# Patient Record
Sex: Female | Born: 1975 | Race: Black or African American | Hispanic: No | Marital: Married | Smoking: Never smoker
Health system: Southern US, Community
[De-identification: ages and names within clinical notes are randomized; demographics above are authoritative.]

## PROBLEM LIST (undated history)

## (undated) DIAGNOSIS — E669 Obesity, unspecified: Secondary | ICD-10-CM

## (undated) DIAGNOSIS — J45909 Unspecified asthma, uncomplicated: Secondary | ICD-10-CM

## (undated) DIAGNOSIS — R569 Unspecified convulsions: Secondary | ICD-10-CM

## (undated) DIAGNOSIS — E7800 Pure hypercholesterolemia, unspecified: Secondary | ICD-10-CM

## (undated) DIAGNOSIS — I10 Essential (primary) hypertension: Secondary | ICD-10-CM

## (undated) DIAGNOSIS — E119 Type 2 diabetes mellitus without complications: Secondary | ICD-10-CM

## (undated) HISTORY — PX: CHOLECYSTECTOMY: SHX55

## (undated) HISTORY — PX: ABDOMINAL HYSTERECTOMY: SHX81

## (undated) HISTORY — PX: GASTRIC BYPASS: SHX52

---

## 2002-06-28 ENCOUNTER — Emergency Department (HOSPITAL_COMMUNITY): Admission: EM | Admit: 2002-06-28 | Discharge: 2002-06-29 | Payer: Self-pay | Admitting: Emergency Medicine

## 2002-06-28 ENCOUNTER — Encounter: Payer: Self-pay | Admitting: Emergency Medicine

## 2002-06-28 ENCOUNTER — Encounter: Payer: Self-pay | Admitting: Neurology

## 2002-07-04 ENCOUNTER — Encounter: Admission: RE | Admit: 2002-07-04 | Discharge: 2002-10-02 | Payer: Self-pay | Admitting: Neurology

## 2002-10-03 ENCOUNTER — Encounter: Admission: RE | Admit: 2002-10-03 | Discharge: 2002-10-06 | Payer: Self-pay | Admitting: Neurology

## 2016-09-06 ENCOUNTER — Encounter (HOSPITAL_BASED_OUTPATIENT_CLINIC_OR_DEPARTMENT_OTHER): Payer: Self-pay | Admitting: Emergency Medicine

## 2016-09-06 ENCOUNTER — Emergency Department (HOSPITAL_BASED_OUTPATIENT_CLINIC_OR_DEPARTMENT_OTHER)
Admission: EM | Admit: 2016-09-06 | Discharge: 2016-09-06 | Disposition: A | Discharge status: Home / Self Care | Payer: Medicaid Other | Attending: Emergency Medicine | Admitting: Emergency Medicine

## 2016-09-06 DIAGNOSIS — R21 Rash and other nonspecific skin eruption: Secondary | ICD-10-CM | POA: Insufficient documentation

## 2016-09-06 DIAGNOSIS — E119 Type 2 diabetes mellitus without complications: Secondary | ICD-10-CM | POA: Insufficient documentation

## 2016-09-06 DIAGNOSIS — J45909 Unspecified asthma, uncomplicated: Secondary | ICD-10-CM | POA: Insufficient documentation

## 2016-09-06 DIAGNOSIS — L2082 Flexural eczema: Secondary | ICD-10-CM | POA: Insufficient documentation

## 2016-09-06 DIAGNOSIS — Z79899 Other long term (current) drug therapy: Secondary | ICD-10-CM | POA: Diagnosis not present

## 2016-09-06 DIAGNOSIS — I10 Essential (primary) hypertension: Secondary | ICD-10-CM | POA: Diagnosis not present

## 2016-09-06 HISTORY — DX: Unspecified convulsions: R56.9

## 2016-09-06 HISTORY — DX: Essential (primary) hypertension: I10

## 2016-09-06 HISTORY — DX: Obesity, unspecified: E66.9

## 2016-09-06 HISTORY — DX: Type 2 diabetes mellitus without complications: E11.9

## 2016-09-06 HISTORY — DX: Unspecified asthma, uncomplicated: J45.909

## 2016-09-06 MED ORDER — TRIAMCINOLONE ACETONIDE 0.025 % EX OINT: 1.0000 "application " | TOPICAL_OINTMENT | Freq: Two times a day (BID) | CUTANEOUS | 0 refills | Status: DC

## 2016-09-06 MED ORDER — PREDNISONE 20 MG PO TABS: ORAL_TABLET | ORAL | 0 refills | Status: DC

## 2016-09-06 NOTE — ED Triage Notes (Signed)
Patient c/o rash to hands, legs, back, and mouth. Patient states she was seen at urgent care on Thursday and was told to apply Cortizone 10 cream. Patient states the rash started on her back. 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

## 2016-09-06 NOTE — ED Provider Notes (Signed)
[facility]   CSN: [id number] Arrival date & time: 09/06/16  1457  By signing my name below, I, Erica Mooney, attest that this documentation has been prepared under the direction and in the presence of Rolan BuccoMelanie Miamarie Moll, MD. Electronically signed, Erica Mooney, ED Scribe. 09/06/16. 5:03 PM.   History   Chief Complaint Chief Complaint  Patient presents with  . Rash    hands, legs, mouth, and back    The history is provided by the patient. No language interpreter was used.   HPI Comments: Erica Mooney is a 40 y.o. female brought in by a family member who has a PMhx of eczema presents to the Emergency Department complaining of itchy, gradual worsening, rash onset x 3 days. Pt states the rash started behind her ears and spread to her back, bilateral hands and bilateral legs. Pt states that she was placed on TPN after having gastric bypass on July 17th [place]. She states she was switched from clear liquids to only white liquids and her rash appeared shortly after.  She does have some oral intake.  Pt [facility] at [facility] last week and was told to apply cortisone 10 cream. The pt denies use of new soaps or detergents and hx of stomach bleeding or ulcers. She has a remote hx of eczema.  Past Medical History:  Diagnosis Date  . Asthma   . Diabetes mellitus without complication (HCC)    resolved with surgery  . Hypertension    resolved with surgery  . Obesity   . Seizures (HCC)     There are no active problems to display for this patient.   Past Surgical History:  Procedure Laterality Date  . ABDOMINAL HYSTERECTOMY    . CHOLECYSTECTOMY    . GASTRIC BYPASS     "sleeve"    OB History    No data available       Home Medications    Prior to Admission medications   Medication Sig Start Date End Date Taking? Authorizing Provider  levETIRAcetam (KEPPRA) 500 MG tablet Take 500 mg by mouth 2 (two) times daily.   Yes  Historical Provider, MD  predniSONE (DELTASONE) 20 MG tablet 2 tabs po daily x 5 days 09/06/16   Rolan BuccoMelanie Kalab Camps, MD  triamcinolone (KENALOG) 0.025 % ointment Apply 1 application topically 2 (two) times daily. 09/06/16   Rolan BuccoMelanie Carlton Sweaney, MD    Family History History reviewed. No pertinent family history.  Social History Social History  Substance Use Topics  . Smoking status: Never Smoker  . Smokeless tobacco: Never Used  . Alcohol use No     Allergies   Betadine [povidone iodine]; Caramel; Contrast media [iodinated diagnostic agents]; Iodine; and Strawberry (diagnostic)   Review of Systems Review of Systems  Constitutional: Negative for fever.  HENT: Negative for facial swelling.   Respiratory: Negative for shortness of breath.   Gastrointestinal: Negative for nausea and vomiting.  Musculoskeletal: Negative for arthralgias, back pain, joint swelling and neck pain.  Skin: Positive for rash. Negative for wound.  Neurological: Negative for weakness, numbness and headaches.  All other systems reviewed and are negative.    Physical Exam Updated Vital Signs BP 130/68 (BP Location: Left Arm)   Pulse 105   Temp 97.7 F (36.5 C) (Oral)   Resp 20   Ht 6\' 2"  (1.88 m)   Wt 280 lb (127 kg)   SpO2 100%   BMI 35.95 kg/m   Physical  Exam  Constitutional: She is oriented to person, place, and time. She appears well-developed and well-nourished.  HENT:  Head: Normocephalic and atraumatic.  Eyes: Pupils are equal, round, and reactive to light.  Neck: Normal range of motion. Neck supple.  Cardiovascular: Normal rate, regular rhythm and normal heart sounds.   Pulmonary/Chest: Effort normal and breath sounds normal. No respiratory distress. She has no wheezes. She has no rales. She exhibits no tenderness.  Abdominal: Soft. Bowel sounds are normal. There is no tenderness. There is no rebound and no guarding.  Musculoskeletal: Normal range of motion. She exhibits no edema.  Patient has a  PICC line in place to her left upper arm. The site appears clean. There is no evidence of infection.  Lymphadenopathy:    She has no cervical adenopathy.  Neurological: She is alert and oriented to person, place, and time.  Skin: Skin is warm and dry. No rash noted.  Scaly, eczematous like rash to flexor creases of elbows and behind both ears.  Small papules to both palms.  No vesicles.  No petechiae or purpura  Psychiatric: She has a normal mood and affect.     ED Treatments / Results  DIAGNOSTIC STUDIES: Oxygen Saturation is 100% on RA, normal by my interpretation.    COORDINATION OF CARE: 3:57 PM Will order steroid cream. Pt Instructed pt to obtain aquafor follow up with PCP. Discussed treatment plan with pt at bedside and pt agreed to plan.    Labs (all labs ordered are listed, but only abnormal results are displayed) Labs Reviewed - No data to display  EKG  EKG Interpretation None       Radiology No results found.  Procedures Procedures (including critical care time)  Medications Ordered in ED Medications - No data to display   Initial Impression / Assessment and Plan / ED Course  I have reviewed the triage vital signs and the nursing notes.  Pertinent labs & imaging results that were available during my care of the patient were reviewed by me and considered in my medical decision making (see chart for details).  Clinical Course    Pt with itchy eczematous like rash.  No signs of infection.  No systemic signs of illness.  Discussed using topical steroids and aquaphor.  Pt requests oral steroids.  I did give her a rx for this but advised her not to start it until being cleared for this by her bariatric surgeon.  Advised if she does start them, to take them with food.  She does have some oral intake in addition to the TPN.  Advised to f/u with her PCP if symptoms not improving.   Final Clinical Impressions(s) / ED Diagnoses   Final diagnoses:  Rash  Flexural  eczema    New Prescriptions Discharge Medication List as of 09/06/2016  4:06 PM    START taking these medications   Details  predniSONE (DELTASONE) 20 MG tablet 2 tabs po daily x 5 days, Print    triamcinolone (KENALOG) 0.025 % ointment Apply 1 application topically 2 (two) times daily., Starting Sun 09/06/2016, Print      I personally performed the services described in this documentation, which was scribed in my presence.  The recorded information has been reviewed and considered.    Rolan Bucco, MD 09/06/16 [phone]

## 2016-09-06 NOTE — ED Notes (Signed)
Pt given d/c instructions as per chart. Rx x 2. Verbalizes understanding. No questions. 

## 2016-12-17 ENCOUNTER — Encounter (HOSPITAL_BASED_OUTPATIENT_CLINIC_OR_DEPARTMENT_OTHER): Payer: Self-pay | Admitting: *Deleted

## 2016-12-17 ENCOUNTER — Emergency Department (HOSPITAL_BASED_OUTPATIENT_CLINIC_OR_DEPARTMENT_OTHER)
Admission: EM | Admit: 2016-12-17 | Discharge: 2016-12-17 | Disposition: A | Discharge status: Home / Self Care | Payer: Medicaid Other | Attending: Emergency Medicine | Admitting: Emergency Medicine

## 2016-12-17 DIAGNOSIS — E119 Type 2 diabetes mellitus without complications: Secondary | ICD-10-CM | POA: Insufficient documentation

## 2016-12-17 DIAGNOSIS — S60463A Insect bite (nonvenomous) of left middle finger, initial encounter: Secondary | ICD-10-CM | POA: Diagnosis present

## 2016-12-17 DIAGNOSIS — J45909 Unspecified asthma, uncomplicated: Secondary | ICD-10-CM | POA: Diagnosis not present

## 2016-12-17 DIAGNOSIS — Y929 Unspecified place or not applicable: Secondary | ICD-10-CM | POA: Insufficient documentation

## 2016-12-17 DIAGNOSIS — I10 Essential (primary) hypertension: Secondary | ICD-10-CM | POA: Diagnosis not present

## 2016-12-17 DIAGNOSIS — L089 Local infection of the skin and subcutaneous tissue, unspecified: Secondary | ICD-10-CM

## 2016-12-17 DIAGNOSIS — W57XXXA Bitten or stung by nonvenomous insect and other nonvenomous arthropods, initial encounter: Secondary | ICD-10-CM | POA: Insufficient documentation

## 2016-12-17 DIAGNOSIS — Y999 Unspecified external cause status: Secondary | ICD-10-CM | POA: Diagnosis not present

## 2016-12-17 DIAGNOSIS — Y939 Activity, unspecified: Secondary | ICD-10-CM | POA: Insufficient documentation

## 2016-12-17 HISTORY — DX: Pure hypercholesterolemia, unspecified: E78.00

## 2016-12-17 LAB — CBG MONITORING, ED: Glucose-Capillary: 91 mg/dL (ref 65–99)

## 2016-12-17 MED ORDER — BACITRACIN ZINC 500 UNIT/GM EX OINT
1.0000 "application " | TOPICAL_OINTMENT | Freq: Once | CUTANEOUS | Status: AC
  Administered 2016-12-17: 1 via TOPICAL

## 2016-12-17 MED ORDER — CLINDAMYCIN HCL 150 MG PO CAPS: 300.0000 mg | ORAL_CAPSULE | Freq: Three times a day (TID) | ORAL | 0 refills | Status: AC

## 2016-12-17 NOTE — Discharge Instructions (Signed)
Apply warm compresses to bumps for 20 minutes at a time, several times per day until resolved. Return if you have swelling or worsening pain of your finger, or if areas become worse despite treatment.

## 2016-12-17 NOTE — ED Provider Notes (Signed)
[facility]Y DEPT Provider Note   CSN: [id number] Arrival date & time: 12/17/16 1204     History    Chief Complaint  Patient presents with  . Insect Bite     HPI Erica Mooney is a 41 y.o. female.  41yo F w/ PMH including HTN, HLD, bariatric surgery, seizures, borderline DM who p/w concern for spider bite. Wilford GristYesterday,she noticed a small, painful bump on the back of her left middle finger. She squeezed some dark drainage from the bump and it is currently still sore. Yesterday evening, she noticed some burning pain on R posterior shoulder w/ associated painful bump. No drainage in this area. She denies seeing any insects. She denies fevers, vomiting, or recent illness but states that she has been fatigued recently.    Past Medical History:  Diagnosis Date  . Asthma   . Diabetes mellitus without complication (HCC)    resolved with surgery  . High cholesterol   . Hypertension    resolved with surgery  . Obesity   . Seizures (HCC)      There are no active problems to display for this patient.   Past Surgical History:  Procedure Laterality Date  . ABDOMINAL HYSTERECTOMY    . CHOLECYSTECTOMY    . GASTRIC BYPASS     "sleeve"    OB History    No data available        Home Medications    Prior to Admission medications   Medication Sig Start Date End Date Taking? Authorizing Provider  levETIRAcetam (KEPPRA) 500 MG tablet Take 500 mg by mouth 2 (two) times daily.   Yes Historical Provider, MD  predniSONE (DELTASONE) 20 MG tablet 2 tabs po daily x 5 days 09/06/16   Rolan BuccoMelanie Belfi, MD  triamcinolone (KENALOG) 0.025 % ointment Apply 1 application topically 2 (two) times daily. 09/06/16   Rolan BuccoMelanie Belfi, MD      No family history on file.   Social History  Substance Use Topics  . Smoking status: Never Smoker  . Smokeless tobacco: Never Used  . Alcohol use No     Allergies     Betadine [povidone iodine]; Caramel; Contrast media [iodinated diagnostic agents];  Iodine; and Strawberry (diagnostic)    Review of Systems  10 Systems reviewed and are negative for acute change except as noted in the HPI.   Physical Exam Updated Vital Signs BP 100/62 (BP Location: Right Arm)   Pulse 94   Temp 98.3 F (36.8 C) (Oral)   Resp 18   Ht 6\' 2"  (1.88 m)   Wt 280 lb (127 kg)   SpO2 98%   BMI 35.95 kg/m   Physical Exam  Constitutional: She is oriented to person, place, and time. She appears well-developed and well-nourished. No distress.  HENT:  Head: Normocephalic and atraumatic.  Eyes: Conjunctivae are normal.  Neck: Neck supple.  Musculoskeletal: Normal range of motion. She exhibits tenderness (dorsal proximal L middle finger). She exhibits no edema.  Neurological: She is alert and oriented to person, place, and time.  Skin: Skin is warm and dry.  Small punctate red macule with center unroofed on dorsal L middle finger, between MCP and PIP joints, no surrounding erythema and no drainage; no swelling of finger; 0.2cm erythematous macule on R posterior shoulder, tender to palpation but no active drainage, no surrounding erythema  Psychiatric: She has a normal mood and affect. Judgment normal.  Nursing note and vitals reviewed.     ED Treatments / Results  Labs (all  labs ordered are listed, but only abnormal results are displayed) Labs Reviewed  CBG MONITORING, ED     EKG  EKG Interpretation  Date/Time:    Ventricular Rate:    PR Interval:    QRS Duration:   QT Interval:    QTC Calculation:   R Axis:     Text Interpretation:           Radiology No results found.  Procedures Procedures (including critical care time) Procedures  Medications Ordered in ED  Medications  bacitracin ointment 1 application (not administered)     Initial Impression / Assessment and Plan / ED Course  I have reviewed the triage vital signs and the nursing notes.  Pertinent labs  that were available during my care of the patient were  reviewed by me and considered in my medical decision making (see chart for details).  Clinical Course     Pt w/ 2 areas of concern for insect bite. L finger w/ small ruptured bump, possibly infected hair follicle that is now drained. Applied bacitracin. Area on shoulder looks like small pimple. US shows no fluid collection in either area. Because of pt's hx of DM, will provide w/ short course of antibiotics to cover cellulitis. BG normal here. Instructed to apply warm compresses and reviewed return precautions including worsening skin changes, finger swelling, or worsening pain. Patient voiced understanding and was discharged in satisfactory condition.  Final Clinical Impressions(s) / ED Diagnoses   Final diagnoses:  None     New Prescriptions   No medications on file       Laurence Spates, MD 12/17/16 1315

## 2016-12-17 NOTE — ED Notes (Signed)
ED MD informed of d/c VS

## 2016-12-17 NOTE — ED Triage Notes (Signed)
States she got bit by a spider on her left middle finger and right shoulder. States was able to squeeze thick substance from her finger and now it is sore. The site on her shoulder is intact as a small raised pimple.

## 2018-01-16 ENCOUNTER — Emergency Department (HOSPITAL_BASED_OUTPATIENT_CLINIC_OR_DEPARTMENT_OTHER): Payer: Medicaid Other

## 2018-01-16 ENCOUNTER — Encounter (HOSPITAL_BASED_OUTPATIENT_CLINIC_OR_DEPARTMENT_OTHER): Payer: Self-pay | Admitting: *Deleted

## 2018-01-16 ENCOUNTER — Emergency Department (HOSPITAL_BASED_OUTPATIENT_CLINIC_OR_DEPARTMENT_OTHER)
Admission: EM | Admit: 2018-01-16 | Discharge: 2018-01-16 | Disposition: A | Discharge status: Home / Self Care | Payer: Medicaid Other | Attending: Emergency Medicine | Admitting: Emergency Medicine

## 2018-01-16 ENCOUNTER — Other Ambulatory Visit: Payer: Self-pay

## 2018-01-16 DIAGNOSIS — Z79899 Other long term (current) drug therapy: Secondary | ICD-10-CM | POA: Insufficient documentation

## 2018-01-16 DIAGNOSIS — K529 Noninfective gastroenteritis and colitis, unspecified: Secondary | ICD-10-CM | POA: Diagnosis not present

## 2018-01-16 DIAGNOSIS — R51 Headache: Secondary | ICD-10-CM | POA: Insufficient documentation

## 2018-01-16 DIAGNOSIS — J45909 Unspecified asthma, uncomplicated: Secondary | ICD-10-CM | POA: Insufficient documentation

## 2018-01-16 DIAGNOSIS — I10 Essential (primary) hypertension: Secondary | ICD-10-CM | POA: Diagnosis not present

## 2018-01-16 DIAGNOSIS — E119 Type 2 diabetes mellitus without complications: Secondary | ICD-10-CM | POA: Insufficient documentation

## 2018-01-16 DIAGNOSIS — R1110 Vomiting, unspecified: Secondary | ICD-10-CM | POA: Diagnosis present

## 2018-01-16 LAB — CBC
HEMATOCRIT: 36.1 % (ref 36.0–46.0)
HEMOGLOBIN: 11.4 g/dL — AB (ref 12.0–15.0)
MCH: 24.7 pg — AB (ref 26.0–34.0)
MCHC: 31.6 g/dL (ref 30.0–36.0)
MCV: 78.1 fL (ref 78.0–100.0)
Platelets: 235 10*3/uL (ref 150–400)
RBC: 4.62 MIL/uL (ref 3.87–5.11)
RDW: 14.1 % (ref 11.5–15.5)
WBC: 6.6 10*3/uL (ref 4.0–10.5)

## 2018-01-16 LAB — BASIC METABOLIC PANEL
ANION GAP: 8 (ref 5–15)
BUN: 7 mg/dL (ref 6–20)
CALCIUM: 8.9 mg/dL (ref 8.9–10.3)
CO2: 24 mmol/L (ref 22–32)
Chloride: 107 mmol/L (ref 101–111)
Creatinine, Ser: 0.7 mg/dL (ref 0.44–1.00)
GFR calc non Af Amer: >60 mL/min (ref >60)
GLUCOSE: 102 mg/dL — AB (ref 65–99)
POTASSIUM: 3.7 mmol/L (ref 3.5–5.1)
Sodium: 139 mmol/L (ref 135–145)

## 2018-01-16 MED ORDER — DICYCLOMINE HCL 10 MG PO CAPS
10.0000 mg | ORAL_CAPSULE | Freq: Once | ORAL | Status: AC
  Administered 2018-01-16: 10 mg via ORAL
  Filled 2018-01-16: qty 1

## 2018-01-16 MED ORDER — METOCLOPRAMIDE HCL 10 MG PO TABS: 10.0000 mg | ORAL_TABLET | Freq: Four times a day (QID) | ORAL | 0 refills | Status: AC | PRN

## 2018-01-16 MED ORDER — DICYCLOMINE HCL 20 MG PO TABS: 20.0000 mg | ORAL_TABLET | Freq: Two times a day (BID) | ORAL | 0 refills | Status: DC | PRN

## 2018-01-16 MED ORDER — ACETAMINOPHEN 500 MG PO TABS
1000.0000 mg | ORAL_TABLET | Freq: Once | ORAL | Status: AC
  Administered 2018-01-16: 1000 mg via ORAL
  Filled 2018-01-16: qty 2

## 2018-01-16 NOTE — ED Notes (Signed)
ED Provider at bedside. 

## 2018-01-16 NOTE — ED Provider Notes (Addendum)
[facility] EMERGENCY DEPARTMENT Provider Note   CSN: [id number] Arrival date & time: 01/16/18  0920     History   Chief Complaint Chief Complaint  Patient presents with  . Emesis    HPI Erica Mooney is a 42 y.o. female.  Patient complains of vomiting diarrhea and migraine headache onset yesterday associated symptoms include abdominal cramps, diffuse.  No fever.  No hematemesis no blood per rectum.  Several others in her family have similar symptoms.  Reports 7 episodes of vomiting and 9 episodes of diarrhea.  Headache is left-sided parietal region typical of migraine she had in the past she denies any lightheadedness .  She went to another hospital prior to coming here she was given Zofran while in the waiting room however did not wait for medical evaluation.  She denies nausea at present.  HPI  Past Medical History:  Diagnosis Date  . Asthma   . Diabetes mellitus without complication (HCC)    resolved with surgery  . High cholesterol   . Hypertension    resolved with surgery  . Obesity   . Seizures (HCC)     There are no active problems to display for this patient.   Past Surgical History:  Procedure Laterality Date  . ABDOMINAL HYSTERECTOMY    . CHOLECYSTECTOMY    . GASTRIC BYPASS     "sleeve"    OB History    No data available       Home Medications    Prior to Admission medications   Medication Sig Start Date End Date Taking? Authorizing Provider  levETIRAcetam (KEPPRA) 500 MG tablet Take 500 mg by mouth 2 (two) times daily.    [provider]    Family History History reviewed. No pertinent family history.  Social History Social History   Tobacco Use  . Smoking status: Never Smoker  . Smokeless tobacco: Never Used  Substance Use Topics  . Alcohol use: No  . Drug use: No     Allergies   Betadine [povidone iodine]; Caramel; Contrast media [iodinated diagnostic agents]; Iodine; and Strawberry (diagnostic)   Review of  Systems Review of Systems  Constitutional: Negative.   HENT: Negative.   Respiratory: Negative.   Cardiovascular: Negative.   Gastrointestinal: Positive for abdominal pain, diarrhea and vomiting.  Musculoskeletal: Negative.   Skin: Negative.   Neurological: Positive for headaches.  Psychiatric/Behavioral: Negative.   All other systems reviewed and are negative.    Physical Exam Updated Vital Signs BP 123/82   Pulse 78   Temp 98.3 F (36.8 C) (Oral)   Resp 16   Ht 6\' 2"  (1.88 m)   Wt 132 kg (291 lb)   SpO2 97%   BMI 37.36 kg/m   Physical Exam  Constitutional: She is oriented to person, place, and time. She appears well-developed and well-nourished. No distress.  HENT:  Head: Normocephalic and atraumatic.  Eyes: Conjunctivae are normal. Pupils are equal, round, and reactive to light.  Neck: Neck supple. No tracheal deviation present. No thyromegaly present.  Cardiovascular: Normal rate and regular rhythm.  No murmur heard. Pulmonary/Chest: Effort normal and breath sounds normal.  Abdominal: Soft. Bowel sounds are normal. She exhibits no distension and no mass. There is tenderness. There is no rebound and no guarding.  Morbidly obese, tender at epigastrium  Musculoskeletal: Normal range of motion. She exhibits no edema or tenderness.  Neurological: She is alert and oriented to person, place, and time. No cranial nerve deficit. Coordination normal.  Normal  moves all extremities well not lightheaded on standing  Skin: Skin is warm and dry. No rash noted.  Psychiatric: She has a normal mood and affect.  Nursing note and vitals reviewed.    ED Treatments / Results  Labs (all labs ordered are listed, but only abnormal results are displayed) Labs Reviewed  BASIC METABOLIC PANEL  CBC    EKG  EKG Interpretation None      Results for orders placed or performed during the hospital encounter of 01/16/18  Basic metabolic panel  Result Value Ref Range   Sodium 139 135  - 145 mmol/L   Potassium 3.7 3.5 - 5.1 mmol/L   Chloride 107 101 - 111 mmol/L   CO2 24 22 - 32 mmol/L   Glucose, Bld 102 (H) 65 - 99 mg/dL   BUN 7 6 - 20 mg/dL   Creatinine, Ser 4.09 0.44 - 1.00 mg/dL   Calcium 8.9 8.9 - 81.1 mg/dL   GFR calc non Af Amer >60 >60 mL/min   GFR calc Af Amer >60 >60 mL/min   Anion gap 8 5 - 15  CBC  Result Value Ref Range   WBC 6.6 4.0 - 10.5 K/uL   RBC 4.62 3.87 - 5.11 MIL/uL   Hemoglobin 11.4 (L) 12.0 - 15.0 g/dL   HCT 91.4 78.2 - 95.6 %   MCV 78.1 78.0 - 100.0 fL   MCH 24.7 (L) 26.0 - 34.0 pg   MCHC 31.6 30.0 - 36.0 g/dL   RDW 21.3 08.6 - 57.8 %   Platelets 235 150 - 400 K/uL   Dg Abd Acute W/chest  Result Date: 01/16/2018 CLINICAL DATA:  Nausea, vomiting and diarrhea. EXAM: DG ABDOMEN ACUTE W/ 1V CHEST COMPARISON:  None. FINDINGS: The heart size is normal. There is no evidence of pulmonary edema, consolidation, pneumothorax, nodule or pleural fluid. Abdominal films show clips related to prior cholecystectomy. There are some scattered air-fluid levels which appear to be primary colonic. Findings are suggestive of enteritis/colitis. No bowel obstruction or free air. Scattered calcified phleboliths. IMPRESSION: Scattered air-fluid levels primarily in the colon. Findings are suggestive of enteritis/colitis. Electronically Signed   By: Irish Lack M.D.   On: 01/16/2018 10:17  X-rays viewed by me Radiology No results found.  Procedures Procedures (including critical care time)  Medications Ordered in ED Medications - No data to display  Initial Impression / Assessment and Plan / ED Course  I have reviewed the triage vital signs and the nursing notes.  Pertinent labs & imaging results that were available during my care of the patient were reviewed by me and considered in my medical decision making (see chart for details).     12 noon patient continues to complain of abdominal cramps.  She does feel ready to go home.  Headache improved after  Tylenol.  She is not ill-appearing.  Plan prescription Reglan, Bentyl.  She can take Imodium for diarrhea avoid dairy.  Encourage oral hydration.  Follow-up with primary care physician if continuing to have vomiting or diarrhea in 48 hours.  Return precautions were given for intractable vomiting, worsening pain, fever.  Symptoms consistent with gastroenteritis Final Clinical Impressions(s) / ED Diagnoses  Diagnosis gastroenteritis Final diagnoses:  None    ED Discharge Orders    None       Doug Sou, MD 01/16/18 1214    Doug Sou, MD 01/16/18 1214

## 2018-01-16 NOTE — Discharge Instructions (Signed)
Avoid milk or foods containing milk such as cheese or ice cream while having diarrhea.  Take Imodium as directed for diarrhea.  You can also take Tylenol as needed for pain every 4 hours as needed . Make sure that you drink at least six 8 ounce glasses of water  each day in order to stay well-hydrated.  Take the medication prescribed as needed for abdominal cramps or for nausea.  Return to the emergency department for worsening pain, fever, if you continue to vomit after taking the medication prescribed or if concern for any reason.  See your doctor if not feeling better in 2 or 3 days

## 2018-01-16 NOTE — ED Triage Notes (Signed)
Pt c/o n/v/d x 12hrs , given [facility] , but left d/t wait. Others in house sick with same

## 2019-10-24 ENCOUNTER — Other Ambulatory Visit: Payer: Self-pay

## 2019-10-24 DIAGNOSIS — Z20822 Contact with and (suspected) exposure to covid-19: Secondary | ICD-10-CM

## 2019-10-26 LAB — NOVEL CORONAVIRUS, NAA: SARS-CoV-2, NAA: NOT DETECTED

## 2020-02-12 ENCOUNTER — Emergency Department (HOSPITAL_BASED_OUTPATIENT_CLINIC_OR_DEPARTMENT_OTHER): Payer: Medicaid Other

## 2020-02-12 ENCOUNTER — Other Ambulatory Visit: Payer: Self-pay

## 2020-02-12 ENCOUNTER — Emergency Department (HOSPITAL_BASED_OUTPATIENT_CLINIC_OR_DEPARTMENT_OTHER)
Admission: EM | Admit: 2020-02-12 | Discharge: 2020-02-12 | Disposition: A | Discharge status: Home / Self Care | Payer: Medicaid Other | Attending: Emergency Medicine | Admitting: Emergency Medicine

## 2020-02-12 ENCOUNTER — Encounter (HOSPITAL_BASED_OUTPATIENT_CLINIC_OR_DEPARTMENT_OTHER): Payer: Self-pay | Admitting: *Deleted

## 2020-02-12 DIAGNOSIS — E782 Mixed hyperlipidemia: Secondary | ICD-10-CM | POA: Diagnosis not present

## 2020-02-12 DIAGNOSIS — R519 Headache, unspecified: Secondary | ICD-10-CM | POA: Insufficient documentation

## 2020-02-12 DIAGNOSIS — E119 Type 2 diabetes mellitus without complications: Secondary | ICD-10-CM | POA: Diagnosis not present

## 2020-02-12 DIAGNOSIS — J45909 Unspecified asthma, uncomplicated: Secondary | ICD-10-CM | POA: Diagnosis not present

## 2020-02-12 DIAGNOSIS — R0789 Other chest pain: Secondary | ICD-10-CM | POA: Insufficient documentation

## 2020-02-12 DIAGNOSIS — R079 Chest pain, unspecified: Secondary | ICD-10-CM

## 2020-02-12 LAB — BASIC METABOLIC PANEL
Anion gap: 10 (ref 5–15)
BUN: 13 mg/dL (ref 6–20)
CO2: 24 mmol/L (ref 22–32)
Calcium: 9 mg/dL (ref 8.9–10.3)
Chloride: 105 mmol/L (ref 98–111)
Creatinine, Ser: 0.71 mg/dL (ref 0.44–1.00)
GFR calc Af Amer: >60 mL/min (ref >60)
GFR calc non Af Amer: >60 mL/min (ref >60)
Glucose, Bld: 95 mg/dL (ref 70–99)
Potassium: 4 mmol/L (ref 3.5–5.1)
Sodium: 139 mmol/L (ref 135–145)

## 2020-02-12 LAB — CBC
HCT: 38.2 % (ref 36.0–46.0)
Hemoglobin: 11.7 g/dL — ABNORMAL LOW (ref 12.0–15.0)
MCH: 24.6 pg — ABNORMAL LOW (ref 26.0–34.0)
MCHC: 30.6 g/dL (ref 30.0–36.0)
MCV: 80.4 fL (ref 80.0–100.0)
Platelets: 263 10*3/uL (ref 150–400)
RBC: 4.75 MIL/uL (ref 3.87–5.11)
RDW: 14.6 % (ref 11.5–15.5)
WBC: 6.7 10*3/uL (ref 4.0–10.5)
nRBC: 0 % (ref 0.0–0.2)

## 2020-02-12 LAB — TROPONIN I (HIGH SENSITIVITY): Troponin I (High Sensitivity): <2 ng/L (ref <18)

## 2020-02-12 MED ORDER — METOCLOPRAMIDE HCL 5 MG/ML IJ SOLN
10.0000 mg | Freq: Once | INTRAMUSCULAR | Status: AC
  Administered 2020-02-12: 10 mg via INTRAVENOUS
  Filled 2020-02-12: qty 2

## 2020-02-12 MED ORDER — LACTATED RINGERS IV BOLUS
1000.0000 mL | Freq: Once | INTRAVENOUS | Status: AC
  Administered 2020-02-12: 1000 mL via INTRAVENOUS

## 2020-02-12 MED ORDER — DIPHENHYDRAMINE HCL 50 MG/ML IJ SOLN
25.0000 mg | Freq: Once | INTRAMUSCULAR | Status: AC
  Administered 2020-02-12: 25 mg via INTRAVENOUS
  Filled 2020-02-12: qty 1

## 2020-02-12 MED ORDER — KETOROLAC TROMETHAMINE 15 MG/ML IJ SOLN
15.0000 mg | Freq: Once | INTRAMUSCULAR | Status: AC
  Administered 2020-02-12: 15 mg via INTRAVENOUS
  Filled 2020-02-12: qty 1

## 2020-02-12 NOTE — ED Provider Notes (Signed)
[facility] EMERGENCY DEPARTMENT Provider Note   CSN: [id number] Arrival date & time: 02/12/20  Erica Mooney     History Chief Complaint  Patient presents with  . Medication Reaction    Erica Mooney is a 44 y.o. female.  HPI 44 year old female presents with overall feeling bad.  Started 2 days ago.  Has had global headache, nausea, body aches, especially in her legs, and chest pressure.  She got her first dose of moderna Covid vaccine 2 days prior to this.  Was told is probably side effects.  Went to urgent care and had a point-of-care Covid test was negative today and sent here.  No fevers or cough.  No shortness of breath.  Chest pain is not made worse by anything such as exertion.  Gets headaches fairly often and states this is not as bad as her typical headaches but along the spectrum of what she normally feels. HA is 9/10.  Past Medical History:  Diagnosis Date  . Asthma   . Diabetes mellitus without complication ([place])    resolved with surgery  . High cholesterol   . Hypertension    resolved with surgery  . Obesity   . Seizures ([place])     There are no problems to display for this patient.   Past Surgical History:  Procedure Laterality Date  . ABDOMINAL HYSTERECTOMY    . CHOLECYSTECTOMY    . GASTRIC BYPASS     "sleeve"     OB History   No obstetric history on file.     No family history on file.  Social History   Tobacco Use  . Smoking status: Never Smoker  . Smokeless tobacco: Never Used  Substance Use Topics  . Alcohol use: No  . Drug use: No    Home Medications Prior to Admission medications   Medication Sig Start Date End Date Taking? Authorizing Provider  dicyclomine (BENTYL) 20 MG tablet Take 1 tablet (20 mg total) by mouth 2 (two) times daily as needed for spasms. Take 1 tablet 2 times daily as needed for abdominal cramps 01/16/18  Yes Orlie Dakin, MD  levETIRAcetam (KEPPRA) 500 MG tablet Take 500 mg by mouth 2 (two) times daily.    Yes [provider]  metoCLOPramide (REGLAN) 10 MG tablet Take 1 tablet (10 mg total) by mouth every 6 (six) hours as needed for nausea (nausea/headache). 01/16/18  Yes Orlie Dakin, MD    Allergies    Betadine [povidone iodine], Caramel, Contrast media [iodinated diagnostic agents], Iodine, and Strawberry (diagnostic)  Review of Systems   Review of Systems  Constitutional: Negative for fever.  Respiratory: Negative for cough and shortness of breath.   Cardiovascular: Positive for chest pain.  Gastrointestinal: Negative for abdominal pain and vomiting.  Musculoskeletal: Positive for myalgias.  Neurological: Positive for headaches. Negative for weakness and numbness.  All other systems reviewed and are negative.   Physical Exam Updated Vital Signs BP 122/83   Pulse 64   Temp 98 F (36.7 C) (Oral)   Resp (!) 21   Ht 6' 2.5" (1.892 m)   Wt 135.6 kg   SpO2 100%   BMI 37.88 kg/m   Physical Exam Vitals and nursing note reviewed.  Constitutional:      Appearance: She is well-developed. She is obese.  HENT:     Head: Normocephalic and atraumatic.     Right Ear: External ear normal.     Left Ear: External ear normal.     Nose: Nose  normal.  Eyes:     General:        Right eye: No discharge.        Left eye: No discharge.     Extraocular Movements: Extraocular movements intact.     Pupils: Pupils are equal, round, and reactive to light.  Cardiovascular:     Rate and Rhythm: Normal rate and regular rhythm.     Heart sounds: Normal heart sounds.  Pulmonary:     Effort: Pulmonary effort is normal.     Breath sounds: Normal breath sounds.  Abdominal:     Palpations: Abdomen is soft.     Tenderness: There is no abdominal tenderness.  Musculoskeletal:     Cervical back: Normal range of motion. No rigidity.  Skin:    General: Skin is warm and dry.  Neurological:     Mental Status: She is alert.     Comments: CN 3-12 grossly intact. 5/5 strength in all 4  extremities. Grossly normal sensation. Normal finger to nose.   Psychiatric:        Mood and Affect: Mood is not anxious.     ED Results / Procedures / Treatments   Labs (all labs ordered are listed, but only abnormal results are displayed) Labs Reviewed  CBC - Abnormal; Notable for the following components:      Result Value   Hemoglobin 11.7 (*)    MCH 24.6 (*)    All other components within normal limits  BASIC METABOLIC PANEL  TROPONIN I (HIGH SENSITIVITY)    EKG EKG Interpretation  Date/Time:  Monday February 12 2020 20:51:26 EST Ventricular Rate:  59 PR Interval:    QRS Duration: 102 QT Interval:  423 QTC Calculation: 419 R Axis:   69 Text Interpretation: Sinus rhythm no acute ST/T changes no significant change since 2003 Confirmed by Pricilla Loveless [phone]) on 02/12/2020 10:25:14 PM   Radiology DG Chest 2 View  Result Date: 02/12/2020 CLINICAL DATA:  Nonradiating chest pain and chills EXAM: CHEST - 2 VIEW COMPARISON:  None. FINDINGS: The heart size and mediastinal contours are within normal limits. Both lungs are clear. The visualized skeletal structures are unremarkable. IMPRESSION: No active cardiopulmonary disease. Electronically Signed   By: Alcide Clever M.D.   On: 02/12/2020 20:46    Procedures Procedures (including critical care time)  Medications Ordered in ED Medications  metoCLOPramide (REGLAN) injection 10 mg (10 mg Intravenous Given 02/12/20 2159)  diphenhydrAMINE (BENADRYL) injection 25 mg (25 mg Intravenous Given 02/12/20 2200)  lactated ringers bolus 1,000 mL (0 mLs Intravenous Stopped 02/12/20 2258)  ketorolac (TORADOL) 15 MG/ML injection 15 mg (15 mg Intravenous Given 02/12/20 2200)    ED Course  I have reviewed the triage vital signs and the nursing notes.  Pertinent labs & imaging results that were available during my care of the patient were reviewed by me and considered in my medical decision making (see chart for details).    MDM  Rules/Calculators/A&P                      Patient has had continuous chest pain for multiple days with negative troponin and benign ECG.  She also has a headache which is new for her.  All of these are better with treatment in the ED.  Could be side effect of her recent Covid vaccine.  Either way, I have very low suspicion for ACS, subarachnoid hemorrhage or other CNS emergency.  She appears stable for discharge home with  return precautions.  Follow-up with PCP. Final Clinical Impression(s) / ED Diagnoses Final diagnoses:  Bad headache  Nonspecific chest pain    Rx / DC Orders ED Discharge Orders    None       Pricilla Loveless, MD 02/13/20 [phone]

## 2020-02-12 NOTE — Discharge Instructions (Signed)
If you develop continued, recurrent, or worsening headache, fever, neck stiffness, vomiting, blurry or double vision, chest pain, weakness or numbness in your arms or legs, trouble speaking, or any other new/concerning symptoms then return to the ER for evaluation.

## 2020-02-12 NOTE — ED Triage Notes (Signed)
She had her Covid Vaccine 4 days ago. 2 days ago she started having headaches, chills, chest pressure, fatigue and extreme thirst that has continued until today. She went to [facility] where she had a repeat Covid test that was negative. She was told to come here.

## 2020-02-12 NOTE — ED Notes (Signed)
States rec first (1) Covid Vaccine last Thursday, Saturday began having HA, tiredness, chest tightness, heaviness.

## 2020-05-10 ENCOUNTER — Encounter (HOSPITAL_BASED_OUTPATIENT_CLINIC_OR_DEPARTMENT_OTHER): Payer: Self-pay

## 2020-05-10 ENCOUNTER — Other Ambulatory Visit: Payer: Self-pay

## 2020-05-10 ENCOUNTER — Emergency Department (HOSPITAL_BASED_OUTPATIENT_CLINIC_OR_DEPARTMENT_OTHER)
Admission: EM | Admit: 2020-05-10 | Discharge: 2020-05-10 | Disposition: A | Discharge status: Home / Self Care | Payer: BC Managed Care – PPO | Attending: Emergency Medicine | Admitting: Emergency Medicine

## 2020-05-10 DIAGNOSIS — Z20822 Contact with and (suspected) exposure to covid-19: Secondary | ICD-10-CM | POA: Diagnosis not present

## 2020-05-10 DIAGNOSIS — J45909 Unspecified asthma, uncomplicated: Secondary | ICD-10-CM | POA: Diagnosis not present

## 2020-05-10 DIAGNOSIS — J069 Acute upper respiratory infection, unspecified: Secondary | ICD-10-CM | POA: Diagnosis not present

## 2020-05-10 DIAGNOSIS — I10 Essential (primary) hypertension: Secondary | ICD-10-CM | POA: Diagnosis not present

## 2020-05-10 DIAGNOSIS — J0100 Acute maxillary sinusitis, unspecified: Secondary | ICD-10-CM | POA: Insufficient documentation

## 2020-05-10 DIAGNOSIS — R590 Localized enlarged lymph nodes: Secondary | ICD-10-CM | POA: Diagnosis not present

## 2020-05-10 DIAGNOSIS — J029 Acute pharyngitis, unspecified: Secondary | ICD-10-CM | POA: Diagnosis present

## 2020-05-10 DIAGNOSIS — L539 Erythematous condition, unspecified: Secondary | ICD-10-CM | POA: Insufficient documentation

## 2020-05-10 DIAGNOSIS — E119 Type 2 diabetes mellitus without complications: Secondary | ICD-10-CM | POA: Diagnosis not present

## 2020-05-10 DIAGNOSIS — Z79899 Other long term (current) drug therapy: Secondary | ICD-10-CM | POA: Insufficient documentation

## 2020-05-10 LAB — SARS CORONAVIRUS 2 BY RT PCR (HOSPITAL ORDER, PERFORMED IN ~~LOC~~ HOSPITAL LAB): SARS Coronavirus 2: NEGATIVE

## 2020-05-10 MED ORDER — AMOXICILLIN-POT CLAVULANATE 875-125 MG PO TABS: 1.0000 | ORAL_TABLET | Freq: Two times a day (BID) | ORAL | 0 refills | Status: AC

## 2020-05-10 MED ORDER — BENZONATATE 100 MG PO CAPS: 100.0000 mg | ORAL_CAPSULE | Freq: Three times a day (TID) | ORAL | 0 refills | Status: DC

## 2020-05-10 NOTE — ED Triage Notes (Signed)
Pt c/o URI sx started yesterday with "a knot" to left side of neck-denies cough/fever-noticed swelling to right side of face x 20 min-unable to appreciate swelling-NAD-steady gait

## 2020-05-10 NOTE — Discharge Instructions (Addendum)
As discussed, your Covid test is pending.  Results will become available within the next 2 hours.  I am sending home with cough medication.  Take as prescribed.  I am also sending with an antibiotic for sinusitis.  Take as prescribed and finish all antibiotics.  Please follow-up with PCP if symptoms do not improved within the next week.  Return to the ER for new or worsening symptoms.  You may also take over-the-counter ibuprofen or Tylenol as needed for pain and fever.

## 2020-05-10 NOTE — ED Provider Notes (Signed)
[facility] EMERGENCY DEPARTMENT Provider Note   CSN: [id number] Arrival date & time: 05/10/20  1529     History Chief Complaint  Patient presents with  . URI    Erica Mooney is a 44 y.o. female with a past medical history significant for asthma, diabetes, hyperlipidemia, hypertension, and seizures who presents to the ED due to sore throat and left ear pain x1 day.  She also admits to bilateral facial pressure.  Denies fever and chills.  She admits to an intermittent dry cough from postnasal drip.  No sick contacts or Covid exposures.  She has had both her Covid vaccines.  Sore throat associated with a "knot" on the left side of her neck. Denies changes to phonation. Denies neck pain.  Denies difficulty swallowing, shortness of breath, and chest pain.  She has not tried any thing for symptoms prior to arrival.  No aggravating or alleviating factors.  History obtained from patient and past medical records. No interpreter used during encounter.      Past Medical History:  Diagnosis Date  . Asthma   . Diabetes mellitus without complication ([facility])    resolved with surgery  . High cholesterol   . Hypertension    resolved with surgery  . Obesity   . Seizures ([facility])     There are no problems to display for this patient.   Past Surgical History:  Procedure Laterality Date  . ABDOMINAL HYSTERECTOMY    . CHOLECYSTECTOMY    . GASTRIC BYPASS     "sleeve"     OB History   No obstetric history on file.     No family history on file.  Social History   Tobacco Use  . Smoking status: Never Smoker  . Smokeless tobacco: Never Used  Substance Use Topics  . Alcohol use: No  . Drug use: No    Home Medications Prior to Admission medications   Medication Sig Start Date End Date Taking? Authorizing Provider  amoxicillin-clavulanate (AUGMENTIN) 875-125 MG tablet Take 1 tablet by mouth every 12 (twelve) hours for 7 days. 05/10/20 05/17/20  Mannie Stabile, PA-C    dicyclomine (BENTYL) 20 MG tablet Take 1 tablet (20 mg total) by mouth 2 (two) times daily as needed for spasms. Take 1 tablet 2 times daily as needed for abdominal cramps 01/16/18   Doug Sou, MD  levETIRAcetam (KEPPRA) 500 MG tablet Take 500 mg by mouth 2 (two) times daily.    [provider]  metoCLOPramide (REGLAN) 10 MG tablet Take 1 tablet (10 mg total) by mouth every 6 (six) hours as needed for nausea (nausea/headache). 01/16/18   Doug Sou, MD    Allergies    Bee venom, Betadine [povidone iodine], Caramel, Contrast media [iodinated diagnostic agents], Iodine, and Strawberry (diagnostic)  Review of Systems   Review of Systems  Constitutional: Negative for chills and fever.  HENT: Positive for congestion, postnasal drip, rhinorrhea, sinus pressure and sore throat. Negative for trouble swallowing and voice change.   Respiratory: Positive for cough. Negative for shortness of breath.   Cardiovascular: Negative for chest pain and leg swelling.  Gastrointestinal: Negative for abdominal pain, diarrhea, nausea and vomiting.    Physical Exam Updated Vital Signs BP 118/86 (BP Location: Left Arm)   Pulse 72   Temp 98.2 F (36.8 C) (Oral)   Resp 18   Ht 6\' 2"  (1.88 m)   Wt (!) 137.9 kg   SpO2 100%   BMI 39.03 kg/m   Physical  Exam Vitals and nursing note reviewed.  Constitutional:      General: She is not in acute distress.    Appearance: She is not ill-appearing.  HENT:     Head: Normocephalic.     Right Ear: Tympanic membrane normal.     Left Ear: Tympanic membrane normal.     Ears:     Comments: TM normal bilaterally. No tenderness over mastoid bone bilaterally.     Nose: Nose normal.     Comments: Tenderness palpation over bilateral maxillary sinuses.    Mouth/Throat:     Comments: Posterior oropharynx clear and mucous membranes moist, there is mild erythema but no edema or tonsillar exudates, uvula midline, normal phonation, no trismus, tolerating  secretions without difficulty. Eyes:     Pupils: Pupils are equal, round, and reactive to light.  Neck:     Comments: No meningismus.  Left-sided cervical adenopathy. Cardiovascular:     Rate and Rhythm: Normal rate and regular rhythm.     Pulses: Normal pulses.     Heart sounds: Normal heart sounds. No murmur. No friction rub. No gallop.   Pulmonary:     Effort: Pulmonary effort is normal.     Breath sounds: Normal breath sounds.     Comments: Respirations equal and unlabored, patient able to speak in full sentences, lungs clear to auscultation bilaterally Abdominal:     General: Abdomen is flat. Bowel sounds are normal. There is no distension.     Palpations: Abdomen is soft.     Tenderness: There is no abdominal tenderness. There is no guarding or rebound.  Musculoskeletal:     Cervical back: Neck supple.     Comments: Able to move all 4 extremities without difficulty.   Lymphadenopathy:     Cervical: Cervical adenopathy present.  Skin:    General: Skin is warm and dry.  Neurological:     General: No focal deficit present.  Psychiatric:        Mood and Affect: Mood normal.        Behavior: Behavior normal.     ED Results / Procedures / Treatments   Labs (all labs ordered are listed, but only abnormal results are displayed) Labs Reviewed  SARS CORONAVIRUS 2 BY RT PCR (HOSPITAL ORDER, [facility])    EKG None  Radiology No results found.  Procedures Procedures (including critical care time)  Medications Ordered in ED Medications - No data to display  ED Course  I have reviewed the triage vital signs and the nursing notes.  Pertinent labs & imaging results that were available during my care of the patient were reviewed by me and considered in my medical decision making (see chart for details).    MDM Rules/Calculators/A&P                     44 year old female presents to the ED due to URI symptoms for the past day.  No fever or  chills.  No sick contacts or known Covid exposures.  Upon arrival, patient is afebrile, not tachycardic or hypoxic.  Patient in no acute distress and non-ill-appearing.  Physical exam reassuring.  No meningismus to suggest meningitis.  Bilateral maxillary tenderness. No signs of AOM bilaterally. Throat with very mild erythema. Uvula midline. No tonsillar hypertrophy or exudates. No concern for abscess at this time. Suspect symptoms related to URI. Shared decision making in regards to antibiotics for sinus infection and patient would like antibiotics at this time.  COVID test pending. Will discharge with cough medication. Advised patient to follow-up with PCP if symptoms do not improve within the next week. Strict ED precautions discussed with patient. Patient states understanding and agrees to plan. Patient discharged home in no acute distress and stable vitals.  Final Clinical Impression(s) / ED Diagnoses Final diagnoses:  Viral upper respiratory tract infection  Acute non-recurrent maxillary sinusitis    Rx / DC Orders ED Discharge Orders         Ordered    amoxicillin-clavulanate (AUGMENTIN) 875-125 MG tablet  Every 12 hours     05/10/20 1727           Jesusita Oka 05/10/20 1743    Little, Ambrose Finland, MD 05/10/20 2035

## 2020-05-18 ENCOUNTER — Emergency Department (HOSPITAL_BASED_OUTPATIENT_CLINIC_OR_DEPARTMENT_OTHER)
Admission: EM | Admit: 2020-05-18 | Discharge: 2020-05-18 | Disposition: A | Discharge status: Home / Self Care | Payer: BC Managed Care – PPO | Attending: Emergency Medicine | Admitting: Emergency Medicine

## 2020-05-18 ENCOUNTER — Encounter (HOSPITAL_BASED_OUTPATIENT_CLINIC_OR_DEPARTMENT_OTHER): Payer: Self-pay | Admitting: Emergency Medicine

## 2020-05-18 ENCOUNTER — Emergency Department (HOSPITAL_BASED_OUTPATIENT_CLINIC_OR_DEPARTMENT_OTHER): Payer: BC Managed Care – PPO

## 2020-05-18 ENCOUNTER — Other Ambulatory Visit: Payer: Self-pay

## 2020-05-18 DIAGNOSIS — Y929 Unspecified place or not applicable: Secondary | ICD-10-CM | POA: Insufficient documentation

## 2020-05-18 DIAGNOSIS — I10 Essential (primary) hypertension: Secondary | ICD-10-CM | POA: Insufficient documentation

## 2020-05-18 DIAGNOSIS — Y9389 Activity, other specified: Secondary | ICD-10-CM | POA: Diagnosis not present

## 2020-05-18 DIAGNOSIS — S99922A Unspecified injury of left foot, initial encounter: Secondary | ICD-10-CM | POA: Diagnosis present

## 2020-05-18 DIAGNOSIS — Z79899 Other long term (current) drug therapy: Secondary | ICD-10-CM | POA: Diagnosis not present

## 2020-05-18 DIAGNOSIS — J45909 Unspecified asthma, uncomplicated: Secondary | ICD-10-CM | POA: Diagnosis not present

## 2020-05-18 DIAGNOSIS — E119 Type 2 diabetes mellitus without complications: Secondary | ICD-10-CM | POA: Insufficient documentation

## 2020-05-18 DIAGNOSIS — Y999 Unspecified external cause status: Secondary | ICD-10-CM | POA: Insufficient documentation

## 2020-05-18 DIAGNOSIS — W228XXA Striking against or struck by other objects, initial encounter: Secondary | ICD-10-CM | POA: Diagnosis not present

## 2020-05-18 NOTE — Discharge Instructions (Signed)
Please read and follow all provided instructions.  Your diagnoses today include:  1. Injury of left foot, initial encounter     Tests performed today include:  X-ray of the affected areas - do NOT show any broken bones  Vital signs. See below for your results today.   Medications prescribed:  Please use over-the-counter NSAID medications (ibuprofen, naproxen) as directed on the packaging for pain.   Take any prescribed medications only as directed.  Home care instructions:   Follow any educational materials contained in this packet  Follow R.I.C.E. Protocol:  R - rest your injury   I  - use ice on injury without applying directly to skin  C - compress injury with bandage or splint  E - elevate the injury as much as possible  Follow-up instructions: Please follow-up with your primary care provider if you continue to have significant pain in 1 week. In this case you may have a more severe injury that requires further care.   Return instructions:   Please return if your toes or feet are numb or tingling, appear gray or blue, or you have severe pain (also elevate the leg and loosen splint or wrap if you were given one)  Please return to the Emergency Department if you experience worsening symptoms.   Please return if you have any other emergent concerns.  Additional Information:  Your vital signs today were: BP 132/74 (BP Location: Left Arm)   Pulse 78   Temp 98.5 F (36.9 C) (Oral)   Resp 18   Ht 6\' 2"  (1.88 m)   Wt 131.1 kg   SpO2 100%   BMI 37.11 kg/m  If your blood pressure (BP) was elevated above 135/85 this visit, please have this repeated by your doctor within one month. --------------

## 2020-05-18 NOTE — ED Triage Notes (Addendum)
Pt reports pallet jack hit back of left foot yesterday.  Pt c/o sharp pain to left heel.

## 2020-05-18 NOTE — ED Provider Notes (Signed)
[facility] EMERGENCY DEPARTMENT Provider Note   CSN: [id number] Arrival date & time: 05/18/20  1340     History Chief Complaint  Patient presents with  . Foot Pain    Erica Mooney is a 44 y.o. female.  Patient presents to the emergency department today for left foot and ankle injury sustained yesterday.  Patient was pulling a pallet jack down an incline when it accelerated and struck the back of her foot and ankle.  She had immediate pain and some swelling.  She elevated it last night and swelling improved, however when she went to work today she had return of swelling and pain.  She complains of pain over the back of her foot and posterior ankle.  She has pain across the bottom of her foot beneath her toes as well.  No treatments prior to arrival.  Pain is worse with ambulation.         Past Medical History:  Diagnosis Date  . Asthma   . Diabetes mellitus without complication ([facility])    resolved with surgery  . High cholesterol   . Hypertension    resolved with surgery  . Obesity   . Seizures ([facility])     There are no problems to display for this patient.   Past Surgical History:  Procedure Laterality Date  . ABDOMINAL HYSTERECTOMY    . CHOLECYSTECTOMY    . GASTRIC BYPASS     "sleeve"     OB History   No obstetric history on file.     No family history on file.  Social History   Tobacco Use  . Smoking status: Never Smoker  . Smokeless tobacco: Never Used  Vaping Use  . Vaping Use: Never used  Substance Use Topics  . Alcohol use: No  . Drug use: No    Home Medications Prior to Admission medications   Medication Sig Start Date End Date Taking? Authorizing Provider  benzonatate (TESSALON) 100 MG capsule Take 1 capsule (100 mg total) by mouth every 8 (eight) hours. 05/10/20   Mannie Stabile, PA-C  dicyclomine (BENTYL) 20 MG tablet Take 1 tablet (20 mg total) by mouth 2 (two) times daily as needed for spasms. Take 1 tablet 2 times  daily as needed for abdominal cramps 01/16/18   Doug Sou, MD  levETIRAcetam (KEPPRA) 500 MG tablet Take 500 mg by mouth 2 (two) times daily.    [provider]  metoCLOPramide (REGLAN) 10 MG tablet Take 1 tablet (10 mg total) by mouth every 6 (six) hours as needed for nausea (nausea/headache). 01/16/18   Doug Sou, MD    Allergies    Bee venom, Betadine [povidone iodine], Caramel, Contrast media [iodinated diagnostic agents], Iodine, and Strawberry (diagnostic)  Review of Systems   Review of Systems  Constitutional: Negative for activity change.  Musculoskeletal: Positive for arthralgias, gait problem and joint swelling. Negative for back pain and neck pain.  Skin: Negative for wound.  Neurological: Negative for weakness and numbness.    Physical Exam Updated Vital Signs BP 132/74 (BP Location: Left Arm)   Pulse 78   Temp 98.5 F (36.9 C) (Oral)   Resp 18   Ht 6\' 2"  (1.88 m)   Wt 131.1 kg   SpO2 100%   BMI 37.11 kg/m   Physical Exam Vitals and nursing note reviewed.  Constitutional:      Appearance: She is well-developed.  HENT:     Head: Normocephalic and atraumatic.  Eyes:  Pupils: Pupils are equal, round, and reactive to light.  Cardiovascular:     Pulses: Normal pulses. No decreased pulses.  Musculoskeletal:        General: Tenderness present.     Cervical back: Normal range of motion and neck supple.     Left knee: No tenderness.     Left ankle: Tenderness present. Normal range of motion.     Left Achilles Tendon: Tenderness present. No defects. Thompson's test negative.     Left foot: Tenderness present. No swelling.  Skin:    General: Skin is warm and dry.  Neurological:     Mental Status: She is alert.     Sensory: No sensory deficit.     Comments: Motor, sensation, and vascular distal to the injury is fully intact.      ED Results / Procedures / Treatments   Labs (all labs ordered are listed, but only abnormal results are  displayed) Labs Reviewed - No data to display  EKG None  Radiology No results found.  Procedures Procedures (including critical care time)  Medications Ordered in ED Medications - No data to display  ED Course  I have reviewed the triage vital signs and the nursing notes.  Pertinent labs & imaging results that were available during my care of the patient were reviewed by me and considered in my medical decision making (see chart for details).  Patient seen and examined. X-rays ordered.   Vital signs reviewed and are as follows: BP 132/74 (BP Location: Left Arm)   Pulse 78   Temp 98.5 F (36.9 C) (Oral)   Resp 18   Ht 6\' 2"  (1.88 m)   Wt 131.1 kg   SpO2 100%   BMI 37.11 kg/m   3:10 PM x-ray with some spurring, no fractures.  Patient updated.  Encouraged rice protocol, NSAIDs.  Encourage PCP follow-up 1 week as needed.    MDM Rules/Calculators/A&P                          Patient with heel and posterior ankle injury.  Imaging is negative for fracture.  Lower extremity is neurovascularly intact.  Supportive treatment indicated at this point with follow-up as needed.   Final Clinical Impression(s) / ED Diagnoses Final diagnoses:  Injury of left foot, initial encounter    Rx / DC Orders ED Discharge Orders    None       Carlisle Cater, PA-C 05/18/20 1511    Isla Pence, MD 05/18/20 [phone]

## 2020-11-11 ENCOUNTER — Emergency Department (HOSPITAL_BASED_OUTPATIENT_CLINIC_OR_DEPARTMENT_OTHER): Payer: Medicaid Other

## 2020-11-11 ENCOUNTER — Emergency Department (HOSPITAL_BASED_OUTPATIENT_CLINIC_OR_DEPARTMENT_OTHER)
Admission: EM | Admit: 2020-11-11 | Discharge: 2020-11-11 | Disposition: A | Discharge status: Home / Self Care | Payer: Medicaid Other | Attending: Emergency Medicine | Admitting: Emergency Medicine

## 2020-11-11 ENCOUNTER — Encounter (HOSPITAL_BASED_OUTPATIENT_CLINIC_OR_DEPARTMENT_OTHER): Payer: Self-pay | Admitting: *Deleted

## 2020-11-11 ENCOUNTER — Other Ambulatory Visit: Payer: Self-pay

## 2020-11-11 DIAGNOSIS — R590 Localized enlarged lymph nodes: Secondary | ICD-10-CM | POA: Insufficient documentation

## 2020-11-11 DIAGNOSIS — R079 Chest pain, unspecified: Secondary | ICD-10-CM

## 2020-11-11 DIAGNOSIS — I10 Essential (primary) hypertension: Secondary | ICD-10-CM | POA: Diagnosis not present

## 2020-11-11 DIAGNOSIS — R519 Headache, unspecified: Secondary | ICD-10-CM | POA: Diagnosis not present

## 2020-11-11 DIAGNOSIS — Z7982 Long term (current) use of aspirin: Secondary | ICD-10-CM | POA: Insufficient documentation

## 2020-11-11 DIAGNOSIS — R072 Precordial pain: Secondary | ICD-10-CM | POA: Diagnosis present

## 2020-11-11 DIAGNOSIS — E119 Type 2 diabetes mellitus without complications: Secondary | ICD-10-CM | POA: Diagnosis not present

## 2020-11-11 DIAGNOSIS — J45909 Unspecified asthma, uncomplicated: Secondary | ICD-10-CM | POA: Diagnosis not present

## 2020-11-11 LAB — BASIC METABOLIC PANEL
Anion gap: 8 (ref 5–15)
BUN: 9 mg/dL (ref 6–20)
CO2: 28 mmol/L (ref 22–32)
Calcium: 9.1 mg/dL (ref 8.9–10.3)
Chloride: 103 mmol/L (ref 98–111)
Creatinine, Ser: 0.76 mg/dL (ref 0.44–1.00)
GFR, Estimated: >60 mL/min (ref >60)
Glucose, Bld: 94 mg/dL (ref 70–99)
Potassium: 3.2 mmol/L — ABNORMAL LOW (ref 3.5–5.1)
Sodium: 139 mmol/L (ref 135–145)

## 2020-11-11 LAB — CBC WITH DIFFERENTIAL/PLATELET
Abs Immature Granulocytes: 0.03 10*3/uL (ref 0.00–0.07)
Basophils Absolute: 0 10*3/uL (ref 0.0–0.1)
Basophils Relative: 0 %
Eosinophils Absolute: 0.2 10*3/uL (ref 0.0–0.5)
Eosinophils Relative: 2 %
HCT: 36.2 % (ref 36.0–46.0)
Hemoglobin: 11.1 g/dL — ABNORMAL LOW (ref 12.0–15.0)
Immature Granulocytes: 0 %
Lymphocytes Relative: 37 %
Lymphs Abs: 3.2 10*3/uL (ref 0.7–4.0)
MCH: 24.3 pg — ABNORMAL LOW (ref 26.0–34.0)
MCHC: 30.7 g/dL (ref 30.0–36.0)
MCV: 79.2 fL — ABNORMAL LOW (ref 80.0–100.0)
Monocytes Absolute: 0.6 10*3/uL (ref 0.1–1.0)
Monocytes Relative: 7 %
Neutro Abs: 4.6 10*3/uL (ref 1.7–7.7)
Neutrophils Relative %: 54 %
Platelets: 249 10*3/uL (ref 150–400)
RBC: 4.57 MIL/uL (ref 3.87–5.11)
RDW: 13.8 % (ref 11.5–15.5)
WBC: 8.6 10*3/uL (ref 4.0–10.5)
nRBC: 0 % (ref 0.0–0.2)

## 2020-11-11 LAB — TROPONIN I (HIGH SENSITIVITY)
Troponin I (High Sensitivity): 2 ng/L (ref <18)
Troponin I (High Sensitivity): <2 ng/L (ref <18)

## 2020-11-11 LAB — D-DIMER, QUANTITATIVE: D-Dimer, Quant: 0.32 ug/mL-FEU (ref 0.00–0.50)

## 2020-11-11 MED ORDER — IBUPROFEN 400 MG PO TABS
600.0000 mg | ORAL_TABLET | Freq: Once | ORAL | Status: AC
  Administered 2020-11-11: 600 mg via ORAL
  Filled 2020-11-11: qty 1

## 2020-11-11 NOTE — ED Provider Notes (Signed)
[facility] EMERGENCY DEPARTMENT Provider Note   CSN: [id number] Arrival date & time: 11/11/20  1521     History Chief Complaint  Patient presents with  . chest wall pain    right    Erica Mooney is a 44 y.o. female.   Chest Pain Pain location:  Substernal area Pain quality: aching   Pain radiates to:  Neck Pain severity:  Moderate Onset quality:  Gradual Duration:  1 day Timing:  Constant Chronicity:  New Context: at rest   Relieved by:  Nothing Worsened by:  Nothing Ineffective treatments:  None tried Associated symptoms: headache   Associated symptoms: no back pain, no cough, no dysphagia, no fever, no nausea, no palpitations, no shortness of breath, no syncope and no vomiting     HPI: A 44 year old patient with a history of CVA and obesity presents for evaluation of chest pain. Initial onset of pain was more than 6 hours ago. The patient's chest pain is described as heaviness/pressure/tightness and is not worse with exertion. The patient's chest pain is middle- or left-sided, is not well-localized, is not sharp and does radiate to the arms/jaw/neck. The patient does not complain of nausea and denies diaphoresis. The patient has no history of peripheral artery disease, has not smoked in the past 90 days, denies any history of treated diabetes, has no relevant family history of coronary artery disease (first degree relative at less than age 82), is not hypertensive and has no history of hypercholesterolemia.   Past Medical History:  Diagnosis Date  . Asthma   . Diabetes mellitus without complication (HCC)    resolved with surgery  . High cholesterol   . Hypertension    resolved with surgery  . Obesity   . Seizures (HCC)     There are no problems to display for this patient.   Past Surgical History:  Procedure Laterality Date  . ABDOMINAL HYSTERECTOMY    . CHOLECYSTECTOMY    . GASTRIC BYPASS     "sleeve"     OB History   No obstetric  history on file.     No family history on file.  Social History   Tobacco Use  . Smoking status: Never Smoker  . Smokeless tobacco: Never Used  Vaping Use  . Vaping Use: Never used  Substance Use Topics  . Alcohol use: No  . Drug use: No    Home Medications Prior to Admission medications   Medication Sig Start Date End Date Taking? Authorizing Provider  aspirin 81 MG EC tablet aspirin 81 mg tablet,delayed release   81 mg by oral route. 03/23/17   [provider]  levETIRAcetam (KEPPRA) 500 MG tablet Take 500 mg by mouth 2 (two) times daily.    [provider]  metoCLOPramide (REGLAN) 10 MG tablet Take 1 tablet (10 mg total) by mouth every 6 (six) hours as needed for nausea (nausea/headache). 01/16/18   Doug Sou, MD  dicyclomine (BENTYL) 20 MG tablet Take 1 tablet (20 mg total) by mouth 2 (two) times daily as needed for spasms. Take 1 tablet 2 times daily as needed for abdominal cramps 01/16/18 05/18/20  Doug Sou, MD    Allergies    Bee venom, Betadine [povidone iodine], Caramel, Contrast media [iodinated diagnostic agents], Iodine, and Strawberry (diagnostic)  Review of Systems   Review of Systems  Constitutional: Negative for chills and fever.  HENT: Positive for congestion and sinus pressure. Negative for rhinorrhea, sore throat and trouble swallowing.  Respiratory: Negative for cough and shortness of breath.   Cardiovascular: Positive for chest pain. Negative for palpitations and syncope.  Gastrointestinal: Negative for diarrhea, nausea and vomiting.  Genitourinary: Negative for difficulty urinating and dysuria.  Musculoskeletal: Negative for arthralgias and back pain.  Skin: Negative for rash and wound.  Neurological: Positive for headaches. Negative for light-headedness.    Physical Exam Updated Vital Signs BP 122/78 (BP Location: Right Arm)   Pulse 74   Temp 98.2 F (36.8 C)   Resp 20   Ht 6\' 2"  (1.88 m)   Wt (!) 140.6 kg   SpO2  99%   BMI 39.80 kg/m   Physical Exam Vitals and nursing note reviewed. Exam conducted with a chaperone present.  Constitutional:      General: She is not in acute distress.    Appearance: Normal appearance.  HENT:     Head: Normocephalic and atraumatic.     Nose: No rhinorrhea.     Mouth/Throat:     Mouth: Mucous membranes are moist.     Pharynx: Oropharynx is clear. No oropharyngeal exudate or posterior oropharyngeal erythema.  Eyes:     General:        Right eye: No discharge.        Left eye: No discharge.     Conjunctiva/sclera: Conjunctivae normal.  Cardiovascular:     Rate and Rhythm: Normal rate and regular rhythm.     Heart sounds: No murmur heard.   Pulmonary:     Effort: Pulmonary effort is normal. No respiratory distress.     Breath sounds: No stridor. No wheezing or rales.  Abdominal:     General: Abdomen is flat. There is no distension.     Palpations: Abdomen is soft.  Musculoskeletal:        General: No tenderness or signs of injury.     Right lower leg: No edema.     Left lower leg: No edema.  Lymphadenopathy:     Cervical: Cervical adenopathy (mild left ant cerv chain) present.  Skin:    General: Skin is warm and dry.  Neurological:     General: No focal deficit present.     Mental Status: She is alert. Mental status is at baseline.     Motor: No weakness.  Psychiatric:        Mood and Affect: Mood normal.        Behavior: Behavior normal.     ED Results / Procedures / Treatments   Labs (all labs ordered are listed, but only abnormal results are displayed) Labs Reviewed  CBC WITH DIFFERENTIAL/PLATELET - Abnormal; Notable for the following components:      Result Value   Hemoglobin 11.1 (*)    MCV 79.2 (*)    MCH 24.3 (*)    All other components within normal limits  BASIC METABOLIC PANEL - Abnormal; Notable for the following components:   Potassium 3.2 (*)    All other components within normal limits  D-DIMER, QUANTITATIVE (NOT AT [facility])    TROPONIN I (HIGH SENSITIVITY)  TROPONIN I (HIGH SENSITIVITY)    EKG EKG Interpretation  Date/Time:  Monday November 11 2020 18:46:41 EST Ventricular Rate:  64 PR Interval:    QRS Duration: 89 QT Interval:  394 QTC Calculation: 407 R Axis:   55 Text Interpretation: Sinus rhythm Low voltage, precordial leads Confirmed by 11-02-2004 (Cherlynn Perches) on 11/11/2020 7:29:10 PM   Radiology DG Chest 2 View  Result Date: 11/11/2020 CLINICAL DATA:  Chest  pain EXAM: CHEST - 2 VIEW COMPARISON:  None. FINDINGS: The heart size and mediastinal contours are within normal limits. Both lungs are clear. The visualized skeletal structures are unremarkable. IMPRESSION: No active cardiopulmonary disease. Electronically Signed   By: Jonna Clark M.D.   On: 11/11/2020 19:05    Procedures Procedures (including critical care time)  Medications Ordered in ED Medications  ibuprofen (ADVIL) tablet 600 mg (600 mg Oral Given 11/11/20 1853)    ED Course  I have reviewed the triage vital signs and the nursing notes.  Pertinent labs & imaging results that were available during my care of the patient were reviewed by me and considered in my medical decision making (see chart for details).    MDM Rules/Calculators/A&P HEAR Score: 5                       Symptoms of sinus pressure as well as chest pain and neck pain.  Full range of motion of the neck, no deep space infection palpated or seen on exam.  Mild cervical lymphadenopathy.  Cardiopulmonary exam unremarkable.  Patient has multiple risk factors for coronary artery disease, hear score 5.  History of blood clot, low risk [place] will get D-dimer as well we will get other screening labs and chest x-ray.  EKG sinus rhythm without any acute ischemic change interval abnormality or arrhythmia, nonspecific T wave in lead III.  Labs reviewed thus far unremarkable to include a negative D-dimer.  Radiology myself shows no acute cardiopulmonary pathology.  Patient has  undetectable troponin x2, D-dimer is negative and does not need CT imaging.  Patient is resting comfortably without chest pain.  Feel that she may have sinus infection, at this point would not treat based on timeframe and symptomology but she will need follow-up.  She is told to return if pain worsens or she develops new symptoms or concerns.   Final Clinical Impression(s) / ED Diagnoses Final diagnoses:  Chest pain, unspecified type    Rx / DC Orders ED Discharge Orders    None       Sabino Donovan, MD 11/11/20 2319

## 2020-11-11 NOTE — ED Notes (Signed)
ED Provider at bedside. 

## 2020-11-11 NOTE — ED Notes (Signed)
Patient transported to X-ray 

## 2020-11-11 NOTE — ED Triage Notes (Signed)
C/o right sided chest pain with movt and deep breathing x 1 day

## 2022-01-27 ENCOUNTER — Emergency Department (HOSPITAL_BASED_OUTPATIENT_CLINIC_OR_DEPARTMENT_OTHER)
Admission: EM | Admit: 2022-01-27 | Discharge: 2022-01-27 | Disposition: A | Discharge status: Home / Self Care | Payer: Medicaid Other | Attending: Emergency Medicine | Admitting: Emergency Medicine

## 2022-01-27 ENCOUNTER — Encounter (HOSPITAL_BASED_OUTPATIENT_CLINIC_OR_DEPARTMENT_OTHER): Payer: Self-pay | Admitting: Emergency Medicine

## 2022-01-27 ENCOUNTER — Other Ambulatory Visit: Payer: Self-pay

## 2022-01-27 ENCOUNTER — Emergency Department (HOSPITAL_BASED_OUTPATIENT_CLINIC_OR_DEPARTMENT_OTHER): Payer: Medicaid Other

## 2022-01-27 DIAGNOSIS — W010XXA Fall on same level from slipping, tripping and stumbling without subsequent striking against object, initial encounter: Secondary | ICD-10-CM | POA: Insufficient documentation

## 2022-01-27 DIAGNOSIS — I10 Essential (primary) hypertension: Secondary | ICD-10-CM | POA: Diagnosis not present

## 2022-01-27 DIAGNOSIS — M25561 Pain in right knee: Secondary | ICD-10-CM

## 2022-01-27 DIAGNOSIS — Z7982 Long term (current) use of aspirin: Secondary | ICD-10-CM | POA: Diagnosis not present

## 2022-01-27 DIAGNOSIS — E119 Type 2 diabetes mellitus without complications: Secondary | ICD-10-CM | POA: Insufficient documentation

## 2022-01-27 DIAGNOSIS — R197 Diarrhea, unspecified: Secondary | ICD-10-CM | POA: Diagnosis not present

## 2022-01-27 LAB — COMPREHENSIVE METABOLIC PANEL
ALT: 13 U/L (ref 0–44)
AST: 12 U/L — ABNORMAL LOW (ref 15–41)
Albumin: 3.5 g/dL (ref 3.5–5.0)
Alkaline Phosphatase: 71 U/L (ref 38–126)
Anion gap: 5 (ref 5–15)
BUN: 7 mg/dL (ref 6–20)
CO2: 28 mmol/L (ref 22–32)
Calcium: 8.5 mg/dL — ABNORMAL LOW (ref 8.9–10.3)
Chloride: 102 mmol/L (ref 98–111)
Creatinine, Ser: 0.72 mg/dL (ref 0.44–1.00)
GFR, Estimated: >60 mL/min (ref >60)
Glucose, Bld: 96 mg/dL (ref 70–99)
Potassium: 4.1 mmol/L (ref 3.5–5.1)
Sodium: 135 mmol/L (ref 135–145)
Total Bilirubin: 0.6 mg/dL (ref 0.3–1.2)
Total Protein: 6.9 g/dL (ref 6.5–8.1)

## 2022-01-27 LAB — URINALYSIS, ROUTINE W REFLEX MICROSCOPIC
Bilirubin Urine: NEGATIVE
Glucose, UA: NEGATIVE mg/dL
Hgb urine dipstick: NEGATIVE
Ketones, ur: NEGATIVE mg/dL
Leukocytes,Ua: NEGATIVE
Nitrite: NEGATIVE
Protein, ur: NEGATIVE mg/dL
Specific Gravity, Urine: >=1.03 (ref 1.005–1.030)
pH: 5.5 (ref 5.0–8.0)

## 2022-01-27 LAB — CBC
HCT: 37.6 % (ref 36.0–46.0)
Hemoglobin: 11.8 g/dL — ABNORMAL LOW (ref 12.0–15.0)
MCH: 24.5 pg — ABNORMAL LOW (ref 26.0–34.0)
MCHC: 31.4 g/dL (ref 30.0–36.0)
MCV: 78 fL — ABNORMAL LOW (ref 80.0–100.0)
Platelets: 254 10*3/uL (ref 150–400)
RBC: 4.82 MIL/uL (ref 3.87–5.11)
RDW: 14.2 % (ref 11.5–15.5)
WBC: 7.4 10*3/uL (ref 4.0–10.5)
nRBC: 0 % (ref 0.0–0.2)

## 2022-01-27 LAB — LIPASE, BLOOD: Lipase: 26 U/L (ref 11–51)

## 2022-01-27 LAB — PREGNANCY, URINE: Preg Test, Ur: NEGATIVE

## 2022-01-27 MED ORDER — ONDANSETRON HCL 4 MG PO TABS: 4.0000 mg | ORAL_TABLET | Freq: Four times a day (QID) | ORAL | 0 refills | Status: DC

## 2022-01-27 MED ORDER — DICYCLOMINE HCL 20 MG PO TABS: 20.0000 mg | ORAL_TABLET | Freq: Two times a day (BID) | ORAL | 0 refills | Status: DC | PRN

## 2022-01-27 MED ORDER — CYCLOBENZAPRINE HCL 10 MG PO TABS: 10.0000 mg | ORAL_TABLET | Freq: Two times a day (BID) | ORAL | 0 refills | Status: AC | PRN

## 2022-01-27 MED ORDER — LOPERAMIDE HCL 2 MG PO CAPS: 2.0000 mg | ORAL_CAPSULE | Freq: Four times a day (QID) | ORAL | 0 refills | Status: AC | PRN

## 2022-01-27 NOTE — ED Triage Notes (Signed)
Fell 2 days ago , right knee pain and swelling .  Also reports diarrhea , son here with same symptoms .

## 2022-01-27 NOTE — Discharge Instructions (Signed)
Knee pain-likely you strained a muscle in your knee, given you a muscle relaxer please take as prescribed, recommend over-the-counter pain medication as needed.  Apply ice and heat on the area.  Follow-up with sports medicine for further evaluation. Diarrhea-likely viral GI bug given you Bentyl for stomach spasms, Zofran for nausea, Imodium for diarrhea.  Please eat a bland diet, and stay hydrated.  Follow-up with your PCP as needed.  Come back to the emergency department if you develop chest pain, shortness of breath, severe abdominal pain, uncontrolled nausea, vomiting, diarrhea.

## 2022-01-27 NOTE — ED Provider Notes (Signed)
[facility] EMERGENCY DEPARTMENT Provider Note   CSN: [id number] Arrival date & time: 01/27/22  3976     History  Chief Complaint  Patient presents with   Fall   Diarrhea    Erica Mooney is a 46 y.o. female.  HPI  Patient with medical history including diabetes, hypertension, gastric bypass, cholecystectomy, abdominal hysterectomy presents with complaints of a fall.  Patient states that on Sunday she was trying to get to the bathroom and unfortunately tripped causing her to fall onto her right knee.  She denies hitting her head, losing conscious, is not on anticoag.  She states that she has pain in her right knee, is worse with movement improved with rest, denies any paresthesias or weakness moving down her leg, denies any headaches, change in vision, paresthesia or weakness upper or lower extremities, denies any neck or back pain no chest pain.  She notes that on Sunday she was having nausea vomiting diarrhea, started after she ate at a restaurant, her family  also have the same symptoms and thinks that they have food poisoning. she states the nausea vomiting has since resolved but she still has diarrhea, she denies any melena or hematochezia, denies any hematemesis or coffee-ground emesis.  She states that she has some stomach soreness but no actual pain.  She has no other complaints at this time.  No URI-like symptoms, no fevers or chills, she has taken nothing for her pain.  Home Medications Prior to Admission medications   Medication Sig Start Date End Date Taking? Authorizing Provider  cyclobenzaprine (FLEXERIL) 10 MG tablet Take 1 tablet (10 mg total) by mouth 2 (two) times daily as needed for muscle spasms. 01/27/22  Yes Marcello Fennel, PA-C  dicyclomine (BENTYL) 20 MG tablet Take 1 tablet (20 mg total) by mouth 2 (two) times daily as needed for spasms. 01/27/22  Yes Marcello Fennel, PA-C  loperamide (IMODIUM) 2 MG capsule Take 1 capsule (2 mg total) by  mouth 4 (four) times daily as needed for diarrhea or loose stools. 01/27/22  Yes Marcello Fennel, PA-C  ondansetron (ZOFRAN) 4 MG tablet Take 1 tablet (4 mg total) by mouth every 6 (six) hours. 01/27/22  Yes Marcello Fennel, PA-C  aspirin 81 MG EC tablet aspirin 81 mg tablet,delayed release   81 mg by oral route. 03/23/17   [provider]  levETIRAcetam (KEPPRA) 500 MG tablet Take 500 mg by mouth 2 (two) times daily.    [provider]  metoCLOPramide (REGLAN) 10 MG tablet Take 1 tablet (10 mg total) by mouth every 6 (six) hours as needed for nausea (nausea/headache). 01/16/18   Orlie Dakin, MD      Allergies    Bee venom, Betadine [povidone iodine], Caramel, Contrast media [iodinated contrast media], Iodine, and Strawberry (diagnostic)    Review of Systems   Review of Systems  Constitutional:  Negative for chills and fever.  Respiratory:  Negative for shortness of breath.   Cardiovascular:  Negative for chest pain.  Gastrointestinal:  Positive for diarrhea. Negative for abdominal pain, nausea and vomiting.  Musculoskeletal:        Right knee pain.  Neurological:  Negative for headaches.   Physical Exam Updated Vital Signs BP 111/67 (BP Location: Right Arm)    Pulse 81    Temp 98.3 F (36.8 C) (Oral)    Resp 18    Ht '6\' 2"'  (1.88 m)    Wt (!) 141.5 kg    SpO2  100%    BMI 40.06 kg/m  Physical Exam Vitals and nursing note reviewed.  Constitutional:      General: She is not in acute distress.    Appearance: She is not ill-appearing.  HENT:     Head: Normocephalic and atraumatic.     Comments: No raccoon eyes or battle sign present.    Nose: No congestion.     Mouth/Throat:     Mouth: Mucous membranes are moist.     Pharynx: Oropharynx is clear.     Comments: No trismus no torticollis Eyes:     Extraocular Movements: Extraocular movements intact.     Conjunctiva/sclera: Conjunctivae normal.     Pupils: Pupils are equal, round, and reactive to light.   Cardiovascular:     Rate and Rhythm: Normal rate and regular rhythm.     Pulses: Normal pulses.     Heart sounds: No murmur heard.   No friction rub. No gallop.  Pulmonary:     Effort: No respiratory distress.     Breath sounds: No wheezing, rhonchi or rales.  Abdominal:     Palpations: Abdomen is soft.     Tenderness: There is no abdominal tenderness. There is no right CVA tenderness or left CVA tenderness.  Musculoskeletal:     Cervical back: No tenderness.     Comments: Moving all 4 extremities, spine was palpated no step-off or deformities noted, no tenderness.  No pelvis instability, no leg shortening or external or internal rotation present.  She has slight tenderness palpation along the lateral aspect of the tibial plateau, no crepitus or deformities present.  Neurovascularly intact in lower extremities.  Good pedal pulses bilaterally.  Skin:    General: Skin is warm and dry.  Neurological:     Mental Status: She is alert.     Comments: No facial asymmetry, no difficulty with word finding, able to follow two-step commands, no unilateral weakness present.  Psychiatric:        Mood and Affect: Mood normal.    ED Results / Procedures / Treatments   Labs (all labs ordered are listed, but only abnormal results are displayed) Labs Reviewed  COMPREHENSIVE METABOLIC PANEL - Abnormal; Notable for the following components:      Result Value   Calcium 8.5 (*)    AST 12 (*)    All other components within normal limits  CBC - Abnormal; Notable for the following components:   Hemoglobin 11.8 (*)    MCV 78.0 (*)    MCH 24.5 (*)    All other components within normal limits  LIPASE, BLOOD  URINALYSIS, ROUTINE W REFLEX MICROSCOPIC  PREGNANCY, URINE    EKG None  Radiology DG Knee Complete 4 Views Right  Result Date: 01/27/2022 CLINICAL DATA:  Right lateral knee pain after fall 3 days ago. EXAM: RIGHT KNEE - COMPLETE 4+ VIEW COMPARISON:  None. FINDINGS: No evidence of fracture,  dislocation, or definite joint effusion. Early degenerative marginal spurring. IMPRESSION: No acute finding. Mild degenerative spurring. Electronically Signed   By: Jorje Guild M.D.   On: 01/27/2022 10:12    Procedures Procedures    Medications Ordered in ED Medications - No data to display  ED Course/ Medical Decision Making/ A&P                           Medical Decision Making Amount and/or Complexity of Data Reviewed Labs: ordered. Radiology: ordered.   This patient presents to  the ED for concern of fall, diarrhea, this involves an extensive number of treatment options, and is a complaint that carries with it a high risk of complications and morbidity.  The differential diagnosis includes C. difficile, fracture, dislocation, diverticulitis    Additional history obtained:  Additional history obtained from electronic medical record    Co morbidities that complicate the patient evaluation  Gastric bypass  Social Determinants of Health:  N/A    Lab Tests:  I Ordered, and personally interpreted labs.  The pertinent results include: CBC shows normocytic anemia he will 11.8, CMP shows calcium 8.5 AST of 12, UA unremarkable, lipase 26, urine pregnancy negative   Imaging Studies ordered:  I ordered imaging studies including DG of right knee I independently visualized and interpreted imaging which showed negative for acute findings I agree with the radiologist interpretation   Cardiac Monitoring:  The patient was maintained on a cardiac monitor.  I personally viewed and interpreted the cardiac monitored which showed an underlying rhythm of: N/A   Medicines ordered and prescription drug management:  I ordered medication including N/A I have reviewed the patients home medicines and have made adjustments as needed  Reevaluation:  Patient was updated on lab work imaging, she has no complaints, she is agreeable for discharge.   Test Considered:  CT on  pelvis but will defer as she is nontoxic-appearing, vital signs reassuring, no leukocytosis, nonsurgical abdomen present my exam.    Rule out low suspicion for intracranial head bleed as patient denies loss of conscious, is not on anticoagulant, she does not endorse headaches, paresthesia/weakness in the upper and lower extremities, no focal deficits present on my exam.  Low suspicion for spinal cord abnormality or spinal fracture spine was palpated was nontender to palpation, patient has full range of motion in the upper and lower extremities.  Low suspicion for intrathoracic or intra-abdominal as she has no signs of trauma present my exam, chest and abdomen were both nontender to palpation.  I have low suspicion for pancreatitis as lipase within normal limits.  Low suspicion for biliary abnormality as he has no elevation liver enzymes alk phos or T. bili, no right upper quadrant tenderness.  Low suspicion for bowel obstruction as she is still passing gas having bowel movements abdomen nondistended my exam.  I have low suspicion for intra-abdominal abnormality she has a nonsurgical abdomen my exam.    Dispostion and problem list  After consideration of the diagnostic results and the patients response to treatment, I feel that the patent would benefit from discharge.  Right knee pain-likely muscular strain after a fall, will provide her with muscle relaxers, have her continue with pain medication, follow-up with PCP or sports medicine for further evaluation. Diarrhea-likely viral gastritis, will provide with antidiarrheals, give her Zofran, Bentyl, stay hydrated and follow-up with PCP as needed.  Gave strict return precautions.            Final Clinical Impression(s) / ED Diagnoses Final diagnoses:  Diarrhea, unspecified type  Acute pain of right knee    Rx / DC Orders ED Discharge Orders          Ordered    dicyclomine (BENTYL) 20 MG tablet  2 times daily PRN        01/27/22  1417    ondansetron (ZOFRAN) 4 MG tablet  Every 6 hours        01/27/22 1417    loperamide (IMODIUM) 2 MG capsule  4 times daily PRN  01/27/22 1417    cyclobenzaprine (FLEXERIL) 10 MG tablet  2 times daily PRN        01/27/22 1417              Marcello Fennel, PA-C 01/27/22 1418    Fredia Sorrow, MD 02/05/22 [phone]

## 2023-06-10 ENCOUNTER — Other Ambulatory Visit: Payer: Self-pay

## 2023-06-10 ENCOUNTER — Emergency Department (HOSPITAL_BASED_OUTPATIENT_CLINIC_OR_DEPARTMENT_OTHER)
Admission: EM | Admit: 2023-06-10 | Discharge: 2023-06-10 | Disposition: A | Discharge status: Home / Self Care | Payer: MEDICAID | Attending: Emergency Medicine | Admitting: Emergency Medicine

## 2023-06-10 ENCOUNTER — Emergency Department (HOSPITAL_BASED_OUTPATIENT_CLINIC_OR_DEPARTMENT_OTHER): Payer: MEDICAID

## 2023-06-10 DIAGNOSIS — E119 Type 2 diabetes mellitus without complications: Secondary | ICD-10-CM | POA: Insufficient documentation

## 2023-06-10 DIAGNOSIS — Z7982 Long term (current) use of aspirin: Secondary | ICD-10-CM | POA: Diagnosis not present

## 2023-06-10 DIAGNOSIS — R1084 Generalized abdominal pain: Secondary | ICD-10-CM | POA: Insufficient documentation

## 2023-06-10 DIAGNOSIS — R569 Unspecified convulsions: Secondary | ICD-10-CM | POA: Diagnosis not present

## 2023-06-10 DIAGNOSIS — K654 Sclerosing mesenteritis: Secondary | ICD-10-CM | POA: Diagnosis not present

## 2023-06-10 DIAGNOSIS — I10 Essential (primary) hypertension: Secondary | ICD-10-CM | POA: Insufficient documentation

## 2023-06-10 DIAGNOSIS — J45909 Unspecified asthma, uncomplicated: Secondary | ICD-10-CM | POA: Diagnosis not present

## 2023-06-10 DIAGNOSIS — R109 Unspecified abdominal pain: Secondary | ICD-10-CM | POA: Diagnosis present

## 2023-06-10 LAB — CBC WITH DIFFERENTIAL/PLATELET
Abs Immature Granulocytes: 0.02 10*3/uL (ref 0.00–0.07)
Basophils Absolute: 0 10*3/uL (ref 0.0–0.1)
Basophils Relative: 1 %
Eosinophils Absolute: 0.2 10*3/uL (ref 0.0–0.5)
Eosinophils Relative: 3 %
HCT: 37.3 % (ref 36.0–46.0)
Hemoglobin: 11.4 g/dL — ABNORMAL LOW (ref 12.0–15.0)
Immature Granulocytes: 0 %
Lymphocytes Relative: 37 %
Lymphs Abs: 2.1 10*3/uL (ref 0.7–4.0)
MCH: 24 pg — ABNORMAL LOW (ref 26.0–34.0)
MCHC: 30.6 g/dL (ref 30.0–36.0)
MCV: 78.5 fL — ABNORMAL LOW (ref 80.0–100.0)
Monocytes Absolute: 0.5 10*3/uL (ref 0.1–1.0)
Monocytes Relative: 9 %
Neutro Abs: 2.9 10*3/uL (ref 1.7–7.7)
Neutrophils Relative %: 50 %
Platelets: 272 10*3/uL (ref 150–400)
RBC: 4.75 MIL/uL (ref 3.87–5.11)
RDW: 14.3 % (ref 11.5–15.5)
WBC: 5.7 10*3/uL (ref 4.0–10.5)
nRBC: 0 % (ref 0.0–0.2)

## 2023-06-10 LAB — URINALYSIS, W/ REFLEX TO CULTURE (INFECTION SUSPECTED)
Bilirubin Urine: NEGATIVE
Glucose, UA: NEGATIVE mg/dL
Ketones, ur: NEGATIVE mg/dL
Leukocytes,Ua: NEGATIVE
Nitrite: NEGATIVE
Protein, ur: NEGATIVE mg/dL
Specific Gravity, Urine: >=1.03 (ref 1.005–1.030)
pH: 6 (ref 5.0–8.0)

## 2023-06-10 LAB — TROPONIN I (HIGH SENSITIVITY)
Troponin I (High Sensitivity): 2 ng/L (ref <18)
Troponin I (High Sensitivity): <2 ng/L (ref <18)

## 2023-06-10 LAB — COMPREHENSIVE METABOLIC PANEL
ALT: 21 U/L (ref 0–44)
AST: 22 U/L (ref 15–41)
Albumin: 3.5 g/dL (ref 3.5–5.0)
Alkaline Phosphatase: 83 U/L (ref 38–126)
Anion gap: 9 (ref 5–15)
BUN: 8 mg/dL (ref 6–20)
CO2: 26 mmol/L (ref 22–32)
Calcium: 8.7 mg/dL — ABNORMAL LOW (ref 8.9–10.3)
Chloride: 104 mmol/L (ref 98–111)
Creatinine, Ser: 0.66 mg/dL (ref 0.44–1.00)
GFR, Estimated: >60 mL/min (ref >60)
Glucose, Bld: 106 mg/dL — ABNORMAL HIGH (ref 70–99)
Potassium: 3.9 mmol/L (ref 3.5–5.1)
Sodium: 139 mmol/L (ref 135–145)
Total Bilirubin: <0.1 mg/dL — ABNORMAL LOW (ref 0.3–1.2)
Total Protein: 7.6 g/dL (ref 6.5–8.1)

## 2023-06-10 LAB — LIPASE, BLOOD: Lipase: 29 U/L (ref 11–51)

## 2023-06-10 LAB — C-REACTIVE PROTEIN: CRP: 1.4 mg/dL — ABNORMAL HIGH (ref <1.0)

## 2023-06-10 LAB — SEDIMENTATION RATE: Sed Rate: 27 mm/hr — ABNORMAL HIGH (ref 0–22)

## 2023-06-10 MED ORDER — LORAZEPAM 2 MG/ML IJ SOLN
INTRAMUSCULAR | Status: AC
  Filled 2023-06-10: qty 1

## 2023-06-10 MED ORDER — ONDANSETRON HCL 4 MG/2ML IJ SOLN
4.0000 mg | Freq: Once | INTRAMUSCULAR | Status: AC
  Administered 2023-06-10: 4 mg via INTRAVENOUS
  Filled 2023-06-10: qty 2

## 2023-06-10 MED ORDER — OXYCODONE HCL 5 MG PO TABS: 5.0000 mg | ORAL_TABLET | ORAL | 0 refills | Status: DC | PRN

## 2023-06-10 MED ORDER — DIPHENHYDRAMINE HCL 50 MG/ML IJ SOLN
25.0000 mg | Freq: Once | INTRAMUSCULAR | Status: AC
  Administered 2023-06-10: 25 mg via INTRAVENOUS
  Filled 2023-06-10: qty 1

## 2023-06-10 MED ORDER — ONDANSETRON HCL 4 MG PO TABS: 4.0000 mg | ORAL_TABLET | Freq: Four times a day (QID) | ORAL | 0 refills | Status: AC

## 2023-06-10 MED ORDER — PREDNISONE 10 MG PO TABS: 40.0000 mg | ORAL_TABLET | Freq: Every day | ORAL | 0 refills | Status: AC

## 2023-06-10 MED ORDER — LEVETIRACETAM IN NACL 1000 MG/100ML IV SOLN
1000.0000 mg | INTRAVENOUS | Status: AC
  Administered 2023-06-10 (×3): 1000 mg via INTRAVENOUS

## 2023-06-10 MED ORDER — DICYCLOMINE HCL 20 MG PO TABS: 20.0000 mg | ORAL_TABLET | Freq: Two times a day (BID) | ORAL | 0 refills | Status: AC | PRN

## 2023-06-10 MED ORDER — SODIUM CHLORIDE 0.9 % IV SOLN: 3000.0000 mg | Freq: Once | INTRAVENOUS | Status: DC

## 2023-06-10 MED ORDER — LORAZEPAM 2 MG/ML IJ SOLN
1.0000 mg | Freq: Once | INTRAMUSCULAR | Status: AC
  Administered 2023-06-10: 1 mg via INTRAVENOUS

## 2023-06-10 MED ORDER — LEVETIRACETAM 500 MG PO TABS: 500.0000 mg | ORAL_TABLET | Freq: Two times a day (BID) | ORAL | 0 refills | Status: AC

## 2023-06-10 MED ORDER — LORAZEPAM 2 MG/ML IJ SOLN: 1.0000 mg | Freq: Once | INTRAMUSCULAR | Status: DC

## 2023-06-10 MED ORDER — HYDROMORPHONE HCL 1 MG/ML IJ SOLN
1.0000 mg | Freq: Once | INTRAMUSCULAR | Status: AC
  Administered 2023-06-10: 1 mg via INTRAVENOUS
  Filled 2023-06-10: qty 1

## 2023-06-10 MED ORDER — MORPHINE SULFATE (PF) 4 MG/ML IV SOLN
4.0000 mg | Freq: Once | INTRAVENOUS | Status: AC
  Administered 2023-06-10: 4 mg via INTRAVENOUS
  Filled 2023-06-10: qty 1

## 2023-06-10 MED ORDER — LEVETIRACETAM IN NACL 1000 MG/100ML IV SOLN
INTRAVENOUS | Status: AC
  Filled 2023-06-10: qty 300

## 2023-06-10 NOTE — Discharge Instructions (Addendum)
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  For these findings, we have started you on prednisone and recommend follow up with your doctor, GI physician and consider rheumatology. We have given you a prescription for steroids.  For your pain, I recommend primarily using tylenol, can take ibuprofen with food, and the prescribed bentyl. For breakthrough pain, I have given you a prescription for oxycodone.  This medication is addicting, constipating and sedating, and can make chronic pain worse the longer run.  If you do not need to take this medication, we recommend taking the other medications instead.  Please make sure you are taking your seizure medications as prescribed, and follow-up with your neurologist.  We have given you a new prescription for Keppra 500 mg twice daily.  Do not drive until 6 months following a seizure or with the recommendation of your neurologist.

## 2023-06-10 NOTE — ED Triage Notes (Signed)
Pt complains of being woken up to chest pain in the center of chest that radiates to left side. Complains of abdominal swelling on left side with right sided abdominal pain that has been intermittent for two months.  States she had a mucus plug fall out vagina this morning.   Hx; of hysterectomy and gastrectomy

## 2023-06-10 NOTE — ED Provider Notes (Signed)
[facility] Provider Note   CSN: [id number] Arrival date & time: 06/10/23  9562     History  Chief Complaint  Patient presents with   Abdominal Pain   multiple complaints    Multiple complaints    Erica Mooney is a 47 y.o. female.  HPI     25 female with a history of prior diabetes that resolved after gastric bypass, hypertension, asthma, seizures, hyperlipidemia, cholecystectomy, who presents with concern for chest pain, abdominal swelling and pain.  Reports that she has had months of symptoms that have been waxing and waning with left lower abdominal pain and swelling, pain in the periumbilical area, and at times pain in the right upper quadrant.  She reports she has had nausea.  Reports her primary care doctor suggested that she was constipated and uses MiraLAX, but she reports that her bowel movements have been normal.  She denies any dysuria, hematuria.  No known history of kidney stones, ovarian cyst.  She had a "mucous plug" come out of her vagina this morning.  She has a history of a hysterectomy.  Reports that the pain has been waxing and waning over the last several months, but worsening today.  Is currently 10 out of 10 in severity.  Past Medical History:  Diagnosis Date   Asthma    Diabetes mellitus without complication (HCC)    resolved with surgery   High cholesterol    Hypertension    resolved with surgery   Obesity    Seizures (HCC)     Past Surgical History:  Procedure Laterality Date   ABDOMINAL HYSTERECTOMY     CHOLECYSTECTOMY     GASTRIC BYPASS     "sleeve"     Home Medications Prior to Admission medications   Medication Sig Start Date End Date Taking? Authorizing Provider  oxyCODONE (ROXICODONE) 5 MG immediate release tablet Take 1 tablet (5 mg total) by mouth every 4 (four) hours as needed for severe pain. 06/10/23  Yes Alvira Monday, MD  predniSONE (DELTASONE) 10 MG tablet Take 4 tablets  (40 mg total) by mouth daily for 7 days. 06/10/23 06/17/23 Yes Alvira Monday, MD  aspirin 81 MG EC tablet aspirin 81 mg tablet,delayed release   81 mg by oral route. 03/23/17   [provider]  cyclobenzaprine (FLEXERIL) 10 MG tablet Take 1 tablet (10 mg total) by mouth 2 (two) times daily as needed for muscle spasms. 01/27/22   Carroll Sage, PA-C  dicyclomine (BENTYL) 20 MG tablet Take 1 tablet (20 mg total) by mouth 2 (two) times daily as needed for spasms. 06/10/23   Alvira Monday, MD  levETIRAcetam (KEPPRA) 500 MG tablet Take 1 tablet (500 mg total) by mouth 2 (two) times daily. 06/10/23 07/10/23  Alvira Monday, MD  loperamide (IMODIUM) 2 MG capsule Take 1 capsule (2 mg total) by mouth 4 (four) times daily as needed for diarrhea or loose stools. 01/27/22   Carroll Sage, PA-C  metoCLOPramide (REGLAN) 10 MG tablet Take 1 tablet (10 mg total) by mouth every 6 (six) hours as needed for nausea (nausea/headache). 01/16/18   Doug Sou, MD  ondansetron (ZOFRAN) 4 MG tablet Take 1 tablet (4 mg total) by mouth every 6 (six) hours. 06/10/23   Alvira Monday, MD      Allergies    Bee venom, Betadine [povidone iodine], Caramel, Contrast media [iodinated contrast media], Dilaudid [hydromorphone], Iodine, Penicillins, and Strawberry (diagnostic)    Review of Systems  Review of Systems  Physical Exam Updated Vital Signs BP 117/85   Pulse 63   Temp 98 F (36.7 C) (Oral)   Resp 20   Ht 6\' 2"  (1.88 m)   Wt (!) 164 kg   SpO2 98%   BMI 46.42 kg/m  Physical Exam Vitals and nursing note reviewed.  Constitutional:      General: She is in acute distress.     Appearance: She is well-developed. She is not ill-appearing or diaphoretic.  HENT:     Head: Normocephalic and atraumatic.  Eyes:     Conjunctiva/sclera: Conjunctivae normal.  Cardiovascular:     Rate and Rhythm: Normal rate and regular rhythm.     Heart sounds: Normal heart sounds. No murmur heard.    No friction  rub. No gallop.  Pulmonary:     Effort: Pulmonary effort is normal. No respiratory distress.     Breath sounds: Normal breath sounds. No wheezing or rales.  Abdominal:     General: There is no distension.     Palpations: Abdomen is soft.     Tenderness: There is abdominal tenderness (diffuse). There is no guarding.  Musculoskeletal:        General: No tenderness.     Cervical back: Normal range of motion.  Skin:    General: Skin is warm and dry.     Findings: No erythema or rash.  Neurological:     Mental Status: She is alert and oriented to person, place, and time.     ED Results / Procedures / Treatments   Labs (all labs ordered are listed, but only abnormal results are displayed) Labs Reviewed  CBC WITH DIFFERENTIAL/PLATELET - Abnormal; Notable for the following components:      Result Value   Hemoglobin 11.4 (*)    MCV 78.5 (*)    MCH 24.0 (*)    All other components within normal limits  COMPREHENSIVE METABOLIC PANEL - Abnormal; Notable for the following components:   Glucose, Bld 106 (*)    Calcium 8.7 (*)    Total Bilirubin <0.1 (*)    All other components within normal limits  URINALYSIS, W/ REFLEX TO CULTURE (INFECTION SUSPECTED) - Abnormal; Notable for the following components:   Hgb urine dipstick TRACE (*)    Bacteria, UA FEW (*)    All other components within normal limits  SEDIMENTATION RATE - Abnormal; Notable for the following components:   Sed Rate 27 (*)    All other components within normal limits  C-REACTIVE PROTEIN - Abnormal; Notable for the following components:   CRP 1.4 (*)    All other components within normal limits  LIPASE, BLOOD  TROPONIN I (HIGH SENSITIVITY)  TROPONIN I (HIGH SENSITIVITY)    EKG EKG Interpretation Date/Time:  Thursday June 10 2023 07:46:17 EDT Ventricular Rate:  57 PR Interval:  193 QRS Duration:  104 QT Interval:  413 QTC Calculation: 403 R Axis:   49  Text Interpretation: Sinus rhythm No significant change  since last tracing Confirmed by Alvira Monday ([id number]) on 06/10/2023 9:18:33 AM  Radiology CT Renal Stone Study  Result Date: 06/10/2023 CLINICAL DATA:  Flank pain EXAM: CT ABDOMEN AND PELVIS WITHOUT CONTRAST TECHNIQUE: Multidetector CT imaging of the abdomen and pelvis was performed following the standard protocol without IV contrast. RADIATION DOSE REDUCTION: This exam was performed according to the departmental dose-optimization program which includes automated exposure control, adjustment of the mA and/or kV according to patient size and/or use of iterative reconstruction technique.  COMPARISON:  01/13/2017 FINDINGS: Lower chest: No acute abnormality. Hepatobiliary: No focal liver abnormality is seen. Prior cholecystectomy. No biliary ductal dilatation. Pancreas: Unremarkable. No pancreatic ductal dilatation or surrounding inflammatory changes. Spleen: Normal in size without focal abnormality. Adrenals/Urinary Tract: Adrenal glands are unremarkable. Kidneys are normal, without renal calculi, focal lesion, or hydronephrosis. Bladder is unremarkable. Stomach/Bowel: Stomach is within normal limits. No evidence of bowel wall thickening, distention, or inflammatory changes. Appendix is normal. 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 Vascular/Lymphatic: No significant vascular findings are present. No enlarged abdominal or pelvic lymph nodes. Reproductive: Status post hysterectomy. No adnexal masses. Other: No abdominal wall hernia or abnormality. No abdominopelvic ascites. Musculoskeletal: No acute osseous abnormality. No aggressive osseous lesion. IMPRESSION: 1. No acute abdominal or pelvic pathology. 2. No urolithiasis or obstructive uropathy. 3. 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 Electronically Signed   By: Elige Ko M.D.   On: 06/10/2023 11:39   DG Chest 2 View  Result Date: 06/10/2023 CLINICAL DATA:  cp EXAM: CHEST - 2 VIEW COMPARISON:  CXR 12/03/22 FINDINGS: No pleural effusion. No pneumothorax. Normal cardiac and mediastinal contours. No focal airspace opacity. No radiographically apparent displaced rib fractures. Visualized upper abdomen is unremarkable. Vertebral body heights are maintained. IMPRESSION: No focal airspace opacity. Electronically Signed   By: Lorenza Cambridge M.D.   On: 06/10/2023 09:14    Procedures Procedures    Medications Ordered in ED Medications  morphine (PF) 4 MG/ML injection 4 mg (4 mg Intravenous Given 06/10/23 0919)  ondansetron (ZOFRAN) injection 4 mg (4 mg Intravenous Given 06/10/23 0919)  LORazepam (ATIVAN) injection 1 mg ( Intravenous Not Given 06/10/23 0959)  HYDROmorphone (DILAUDID) injection 1 mg (1 mg Intravenous Given 06/10/23 1040)  levETIRAcetam (KEPPRA) IVPB 1000 mg/100 mL premix (0 mg Intravenous Stopped 06/10/23 1302)  diphenhydrAMINE (BENADRYL) injection 25 mg (25 mg Intravenous Given 06/10/23 1113)    ED Course/ Medical Decision Making/ A&P                               37 female with a history of prior diabetes that resolved after gastric bypass, hypertension, asthma, seizures, hyperlipidemia, cholecystectomy, who presents with concern for chest pain, abdominal swelling and pain.  She is presenting with primarily abdominal pain, however considered ACS, PE, dissection, pneumothorax, pneumonia. She has normal bilateral upper and lower extremity pulses, no sign of mediastinal widening on x-ray, and has been having months of symptoms and have low suspicion for acute aortic dissection. Troponin negative and doubt ACS.  No dyspnea, primarily abdominal pain, low suspicion for PE.  Labs completed today are interpreted by me show hemoglobin of 11.4, similar to prior, no  leukocytosis, no electrolyte abnormalities, creatinine elevation, no transaminitis, no elevation in lipase, urinalysis without signs of infection.  Chest x-ray completed and personally eval and interpreted by me shows no evidence of pneumonia, pneumothorax, or pulmonary edema.   CT completed and personally eval and interpreted by me and radiology shows no acute abdominal or pelvic pathology, no obstructive uropathy, does show haziness along the left side of the mesentery per radiology with small scattered lymph nodes similar in appearance to prior in 2018 which can be  seen with sclerosing mesenteritis.  CT does not show signs of adnexal masses or significant cysts, and given duration of pain have low suspicion for ovarian torsion.  She does not have a uterus and have low suspicion for PID.  See air and exam to suggest dehiscence of her prior hysterectomy site.   I suspect her months of waxing and waning pain is secondary to sclerosing mesenteritis given CT findings and history.  She does not have signs of obstruction today.    Will initiate prednisone 40 mg daily, discussed risks of hyperglycemia.  In addition we will treat her pain with oxycodone after review in the [place] drug database, discussion of risks.  Given prescription for Zofran as well.  Recommend follow-up with her gastroenterologist at [facility], and also follow-up with rheumatology and PCP.  Discussed there are other therapies that can be used for this such as tamoxifen, however feel this is best discussed in the outpatient setting, and will initiate prednisone at this time.  In addition to her presentation for abdominal pain, patient had a grand mal seizure while in the emergency department.  Reports that she has been off of her Keppra for 1 month.  She was given a loading dose of Keppra and dose of Ativan. Has returned to baseline. Recommend not driving, discussed Neuro follow up.  Will provide prescription for Keppra.  Patient discharged  in stable condition with understanding of reasons to return.         Final Clinical Impression(s) / ED Diagnoses Final diagnoses:  Sclerosing mesenteritis (HCC)  Generalized abdominal pain  Seizure (HCC)    Rx / DC Orders ED Discharge Orders          Ordered    levETIRAcetam (KEPPRA) 500 MG tablet  2 times daily        06/10/23 1415    dicyclomine (BENTYL) 20 MG tablet  2 times daily PRN        06/10/23 1415    predniSONE (DELTASONE) 10 MG tablet  Daily        06/10/23 1415    ondansetron (ZOFRAN) 4 MG tablet  Every 6 hours        06/10/23 1415    oxyCODONE (ROXICODONE) 5 MG immediate release tablet  Every 4 hours PRN        06/10/23 1415              Alvira Monday, MD 06/11/23 [phone]

## 2023-06-10 NOTE — ED Notes (Signed)
Pt transported to radiology.

## 2023-06-10 NOTE — ED Notes (Signed)
Pt began itching after getting dilaudid

## 2023-06-10 NOTE — ED Notes (Signed)
Ct walked into room and found pt unresponsive, attempted to arouse pt, she then began seizing. The witnessed seizure lasted roughly 1 min. Gave ativan 1mg . Pt alert in bed resting with seizure pads applied to bed and call bell within reach.

## 2023-06-10 NOTE — ED Notes (Signed)
ED Provider at bedside. 

## 2023-06-10 NOTE — ED Notes (Signed)
PO challenge successful, EDP ok'd in room

## 2023-12-24 IMAGING — DX DG KNEE COMPLETE 4+V*R*
4 series · 4 of 4 positions shown · non-contrast
Comparison: None.

CLINICAL DATA: Right lateral knee pain after fall 3 days ago.

EXAM:
RIGHT KNEE - COMPLETE 4+ VIEW

[knee ap]
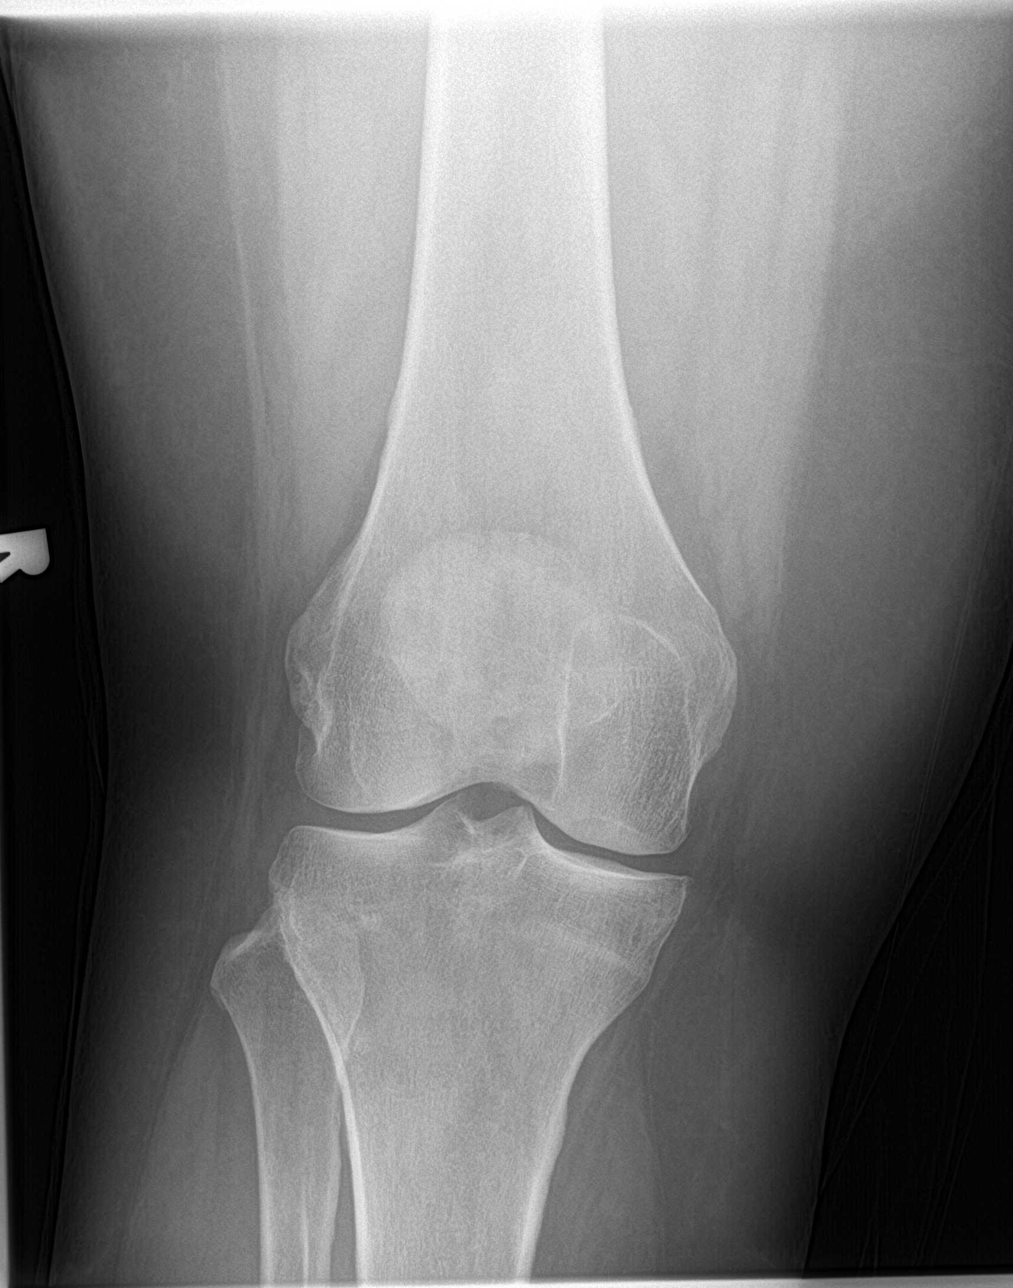

[knee obl (1 of 2)]
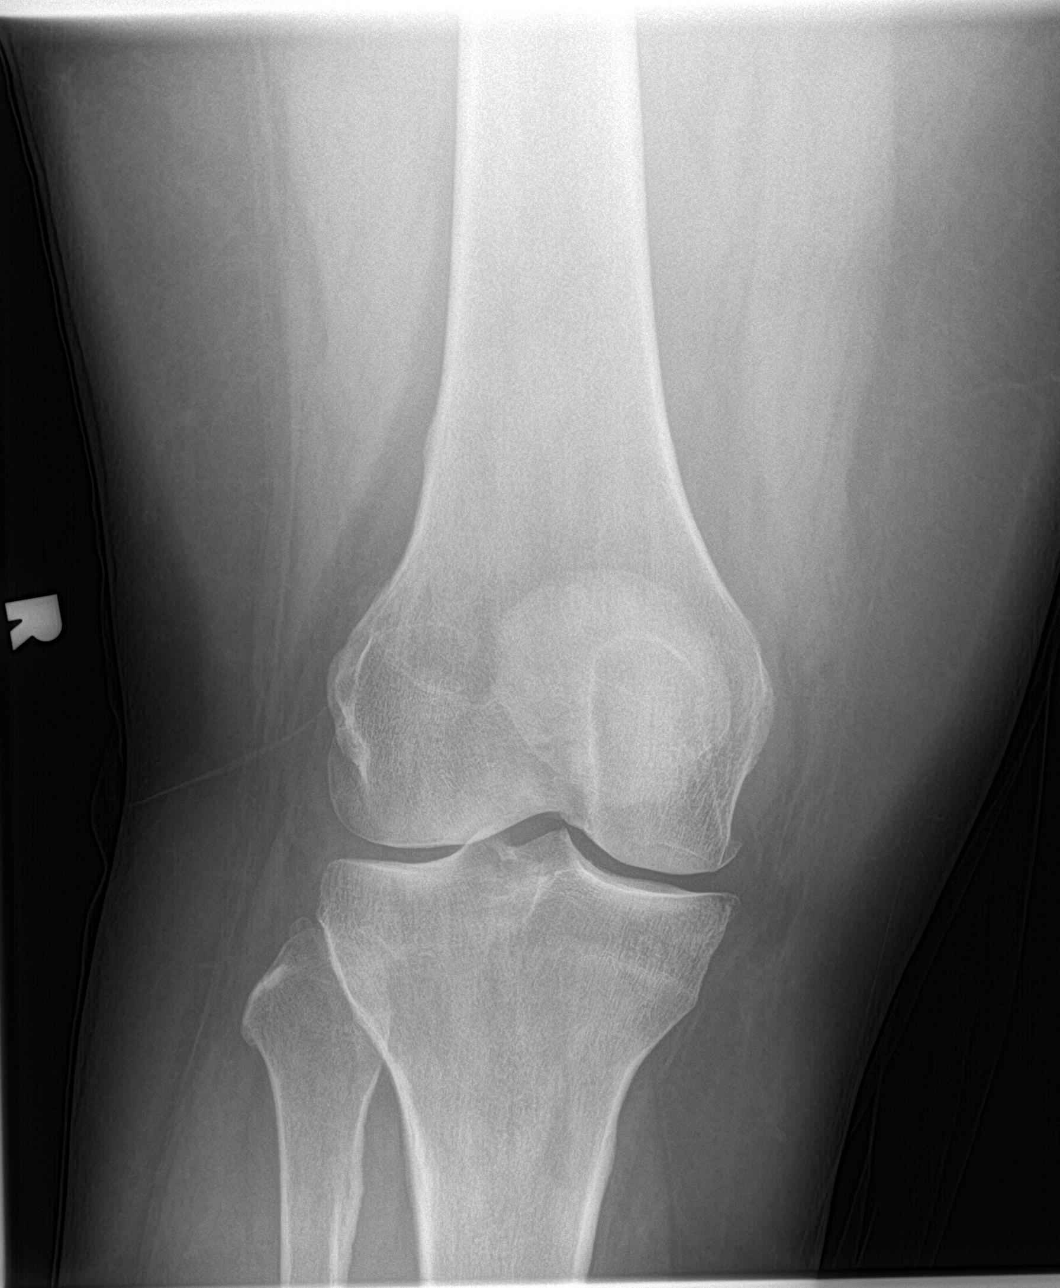

[knee obl (2 of 2)]
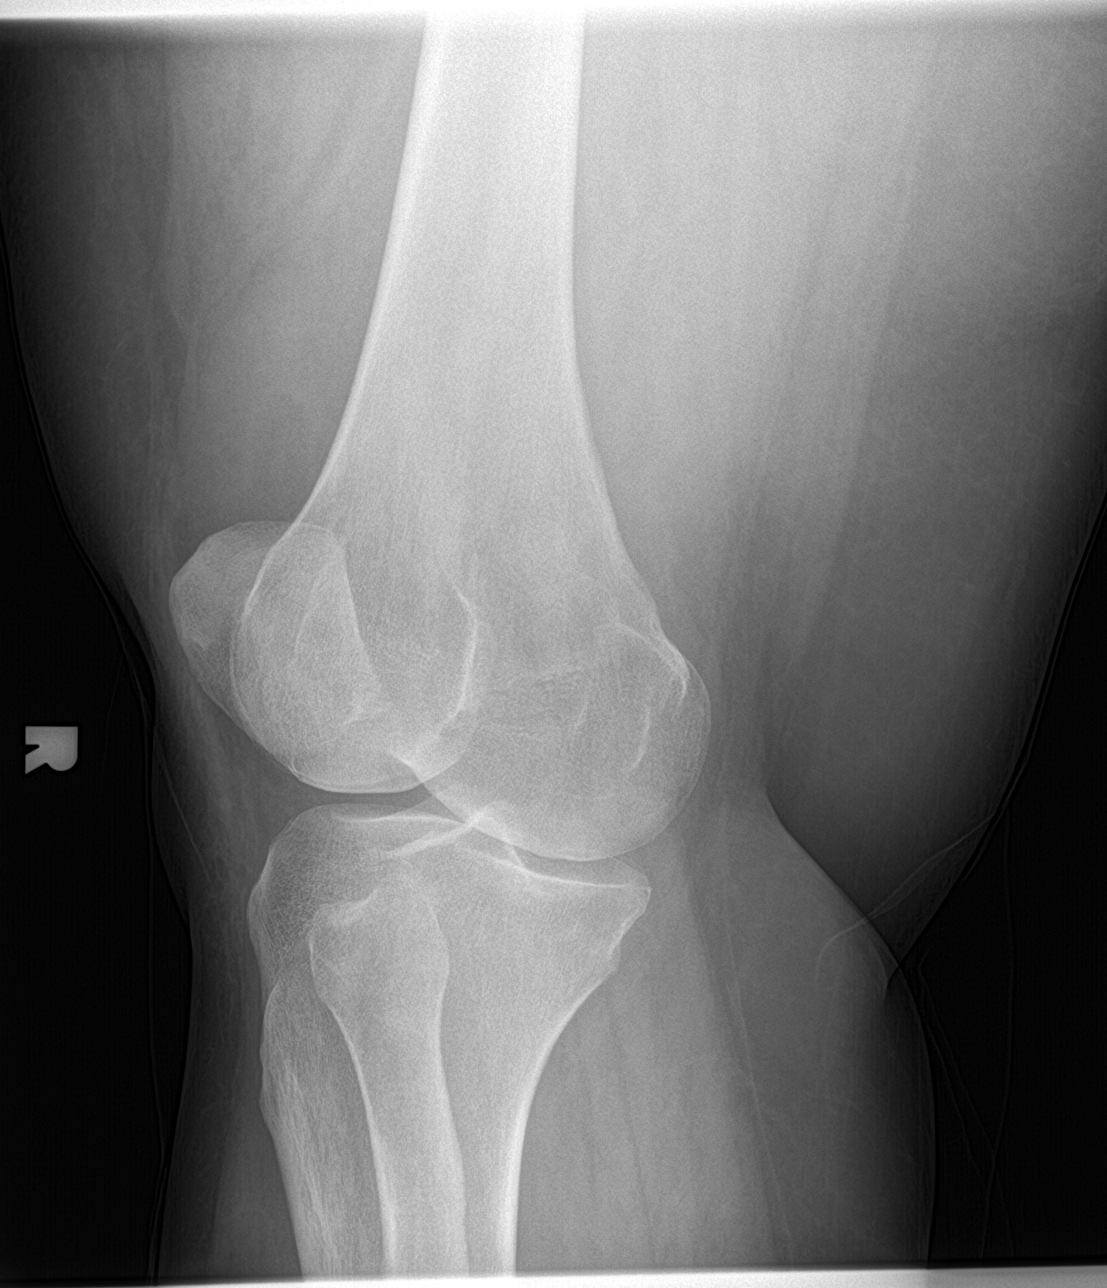

[knee lat]
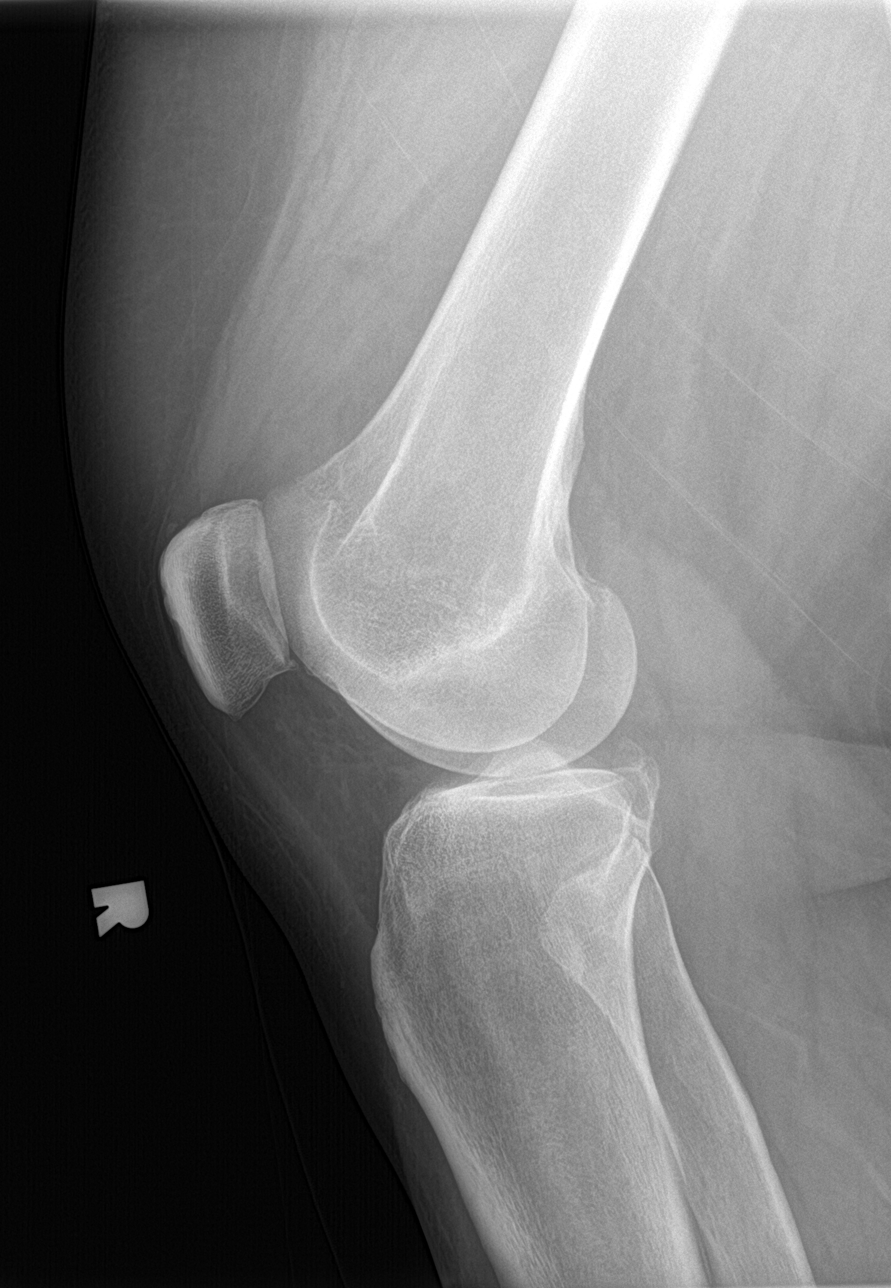

[4 of 4 positions shown; findings below may reference images not displayed]

FINDINGS: No evidence of fracture, dislocation, or definite joint effusion.
Early degenerative marginal spurring.
IMPRESSION: No acute finding.

Mild degenerative spurring.

## 2024-10-29 ENCOUNTER — Emergency Department (HOSPITAL_BASED_OUTPATIENT_CLINIC_OR_DEPARTMENT_OTHER): Payer: MEDICAID

## 2024-10-29 ENCOUNTER — Encounter (HOSPITAL_BASED_OUTPATIENT_CLINIC_OR_DEPARTMENT_OTHER): Payer: Self-pay | Admitting: Emergency Medicine

## 2024-10-29 ENCOUNTER — Other Ambulatory Visit: Payer: Self-pay

## 2024-10-29 ENCOUNTER — Emergency Department (HOSPITAL_BASED_OUTPATIENT_CLINIC_OR_DEPARTMENT_OTHER)
Admission: EM | Admit: 2024-10-29 | Discharge: 2024-10-29 | Disposition: A | Discharge status: Home / Self Care | Payer: MEDICAID | Attending: Emergency Medicine | Admitting: Emergency Medicine

## 2024-10-29 DIAGNOSIS — M79601 Pain in right arm: Secondary | ICD-10-CM | POA: Insufficient documentation

## 2024-10-29 DIAGNOSIS — E119 Type 2 diabetes mellitus without complications: Secondary | ICD-10-CM | POA: Insufficient documentation

## 2024-10-29 DIAGNOSIS — I10 Essential (primary) hypertension: Secondary | ICD-10-CM | POA: Diagnosis not present

## 2024-10-29 DIAGNOSIS — Z7982 Long term (current) use of aspirin: Secondary | ICD-10-CM | POA: Diagnosis not present

## 2024-10-29 DIAGNOSIS — J45909 Unspecified asthma, uncomplicated: Secondary | ICD-10-CM | POA: Insufficient documentation

## 2024-10-29 LAB — CBC WITH DIFFERENTIAL/PLATELET
Abs Immature Granulocytes: 0.02 K/uL (ref 0.00–0.07)
Basophils Absolute: 0 K/uL (ref 0.0–0.1)
Basophils Relative: 1 %
Eosinophils Absolute: 0.2 K/uL (ref 0.0–0.5)
Eosinophils Relative: 3 %
HCT: 38 % (ref 36.0–46.0)
Hemoglobin: 11.7 g/dL — ABNORMAL LOW (ref 12.0–15.0)
Immature Granulocytes: 0 %
Lymphocytes Relative: 38 %
Lymphs Abs: 2.4 K/uL (ref 0.7–4.0)
MCH: 24.1 pg — ABNORMAL LOW (ref 26.0–34.0)
MCHC: 30.8 g/dL (ref 30.0–36.0)
MCV: 78.4 fL — ABNORMAL LOW (ref 80.0–100.0)
Monocytes Absolute: 0.5 K/uL (ref 0.1–1.0)
Monocytes Relative: 8 %
Neutro Abs: 3.1 K/uL (ref 1.7–7.7)
Neutrophils Relative %: 50 %
Platelets: 287 K/uL (ref 150–400)
RBC: 4.85 MIL/uL (ref 3.87–5.11)
RDW: 14.4 % (ref 11.5–15.5)
WBC: 6.3 K/uL (ref 4.0–10.5)
nRBC: 0 % (ref 0.0–0.2)

## 2024-10-29 LAB — COMPREHENSIVE METABOLIC PANEL WITH GFR
ALT: 20 U/L (ref 0–44)
AST: 20 U/L (ref 15–41)
Albumin: 4.3 g/dL (ref 3.5–5.0)
Alkaline Phosphatase: 102 U/L (ref 38–126)
Anion gap: 10 (ref 5–15)
BUN: 9 mg/dL (ref 6–20)
CO2: 27 mmol/L (ref 22–32)
Calcium: 9.4 mg/dL (ref 8.9–10.3)
Chloride: 102 mmol/L (ref 98–111)
Creatinine, Ser: 0.76 mg/dL (ref 0.44–1.00)
GFR, Estimated: >60 mL/min (ref >60)
Glucose, Bld: 98 mg/dL (ref 70–99)
Potassium: 3.8 mmol/L (ref 3.5–5.1)
Sodium: 139 mmol/L (ref 135–145)
Total Bilirubin: 0.4 mg/dL (ref 0.0–1.2)
Total Protein: 7.9 g/dL (ref 6.5–8.1)

## 2024-10-29 LAB — URIC ACID: Uric Acid, Serum: 5 mg/dL (ref 2.5–7.1)

## 2024-10-29 MED ORDER — LIDOCAINE 5 % EX PTCH: 1.0000 | MEDICATED_PATCH | CUTANEOUS | 0 refills | Status: AC

## 2024-10-29 MED ORDER — DICLOFENAC SODIUM 1 % EX GEL: 2.0000 g | Freq: Four times a day (QID) | CUTANEOUS | 1 refills | Status: AC

## 2024-10-29 MED ORDER — NAPROXEN 250 MG PO TABS
500.0000 mg | ORAL_TABLET | Freq: Once | ORAL | Status: AC
  Administered 2024-10-29: 500 mg via ORAL
  Filled 2024-10-29: qty 2

## 2024-10-29 NOTE — ED Triage Notes (Signed)
 Pt c/o RT arm pain x 3 weeks, endorses recent tx for same,  dx with blood clot. Currently taking abx

## 2024-10-29 NOTE — ED Notes (Signed)
 Pt currently taking cephelexin  and aimovig (migraines)

## 2024-10-29 NOTE — ED Provider Notes (Signed)
 [facility] Provider Note   CSN: [id number] Arrival date & time: 10/29/24  9052     Patient presents with: Arm Pain   Erica Mooney is a 48 y.o. female.  Patient past history significant for obesity, seizures, hypertension, type 2 diabetes, asthma presents to the emergency department concerns of arm pain.  Reports ongoing arm pain for about 3 weeks.  Was seen at a different emergency department several weeks ago and at that time was given a prophylactic dose of a blood thinner for concerns of possible thrombophlebitis versus DVT.  He is also currently still taking Keflex for possible cellulitis.   Arm Pain       Prior to Admission medications   Medication Sig Start Date End Date Taking? Authorizing Provider  diclofenac  Sodium (VOLTAREN ) 1 % GEL Apply 2 g topically 4 (four) times daily. 10/29/24  Yes Chele Cornell A, PA-C  lidocaine  (LIDODERM ) 5 % Place 1 patch onto the skin daily. Remove & Discard patch within 12 hours or as directed by MD 10/29/24  Yes Brenn Gatton A, PA-C  aspirin 81 MG EC tablet aspirin 81 mg tablet,delayed release   81 mg by oral route. 03/23/17   [provider]  cyclobenzaprine  (FLEXERIL ) 10 MG tablet Take 1 tablet (10 mg total) by mouth 2 (two) times daily as needed for muscle spasms. 01/27/22   Waylan Elsie PARAS, PA-C  dicyclomine  (BENTYL ) 20 MG tablet Take 1 tablet (20 mg total) by mouth 2 (two) times daily as needed for spasms. 06/10/23   Dreama Longs, MD  levETIRAcetam  (KEPPRA ) 500 MG tablet Take 1 tablet (500 mg total) by mouth 2 (two) times daily. 06/10/23 07/10/23  Dreama Longs, MD  loperamide  (IMODIUM ) 2 MG capsule Take 1 capsule (2 mg total) by mouth 4 (four) times daily as needed for diarrhea or loose stools. 01/27/22   Waylan Elsie PARAS, PA-C  metoCLOPramide  (REGLAN ) 10 MG tablet Take 1 tablet (10 mg total) by mouth every 6 (six) hours as needed for nausea (nausea/headache). 01/16/18    Josem Bring, MD  ondansetron  (ZOFRAN ) 4 MG tablet Take 1 tablet (4 mg total) by mouth every 6 (six) hours. 06/10/23   Dreama Longs, MD  oxyCODONE  (ROXICODONE ) 5 MG immediate release tablet Take 1 tablet (5 mg total) by mouth every 4 (four) hours as needed for severe pain. 06/10/23   Dreama Longs, MD    Allergies: Bee venom, Betadine [povidone iodine], Caramel, Contrast media [iodinated contrast media], Dilaudid  [hydromorphone ], Iodine, Penicillins, and Strawberry (diagnostic)    Review of Systems  Musculoskeletal:        Right arm pain  All other systems reviewed and are negative.   Updated Vital Signs BP (!) 148/91   Pulse 80   Temp 97.8 F (36.6 C) (Oral)   Resp 16   Wt (!) 159.7 kg   SpO2 98%   BMI 45.19 kg/m   Physical Exam Vitals and nursing note reviewed.  Constitutional:      General: She is not in acute distress.    Appearance: She is well-developed.  HENT:     Head: Normocephalic and atraumatic.  Eyes:     Conjunctiva/sclera: Conjunctivae normal.  Cardiovascular:     Rate and Rhythm: Normal rate and regular rhythm.     Heart sounds: No murmur heard. Pulmonary:     Effort: Pulmonary effort is normal. No respiratory distress.     Breath sounds: Normal breath sounds. No wheezing or rales.  Abdominal:     Palpations: Abdomen is soft.     Tenderness: There is no abdominal tenderness.  Musculoskeletal:        General: Swelling and tenderness present. No deformity or signs of injury.     Cervical back: Neck supple.     Comments: TTP along the right wrist, elbow, and shoulder. ROM at baseline but slow due to pain.  Skin:    General: Skin is warm and dry.     Capillary Refill: Capillary refill takes less than 2 seconds.     Comments: Right index finger with slight area of erythema along the MCP and PIP joints, but no mass or swelling.  Neurological:     Mental Status: She is alert.  Psychiatric:        Mood and Affect: Mood normal.     (all labs  ordered are listed, but only abnormal results are displayed) Labs Reviewed  CBC WITH DIFFERENTIAL/PLATELET - Abnormal; Notable for the following components:      Result Value   Hemoglobin 11.7 (*)    MCV 78.4 (*)    MCH 24.1 (*)    All other components within normal limits  COMPREHENSIVE METABOLIC PANEL WITH GFR  URIC ACID    EKG: None  Radiology: DG Shoulder Right Result Date: 10/29/2024 EXAM: 1 VIEW(S) XRAY OF THE RIGHT SHOULDER 10/29/2024 11:27:00 AM COMPARISON: None available. CLINICAL HISTORY: Shoulder pain. FINDINGS: BONES AND JOINTS: Glenohumeral joint is normally aligned. Moderate degenerative changes identified at the [facility] joint. No signs of acute fracture or dislocation. SOFT TISSUES: No abnormal calcifications. Visualized lung is unremarkable. IMPRESSION: 1. No acute fracture or dislocation. Electronically signed by: Waddell Calk MD 10/29/2024 11:36 AM EST RP Workstation: [id number]   DG Hand Complete Right Result Date: 10/29/2024 CLINICAL DATA:  Right hand pain. EXAM: RIGHT HAND - COMPLETE 3+ VIEW COMPARISON:  None Available. FINDINGS: There is no evidence of fracture or dislocation. There is no evidence of arthropathy or other focal bone abnormality. Soft tissues are unremarkable. IMPRESSION: Negative. Electronically Signed   By: Vanetta Chou M.D.   On: 10/29/2024 11:30   US  Venous Img Upper Right (DVT Study) Result Date: 10/29/2024 EXAM: US  Duplex Right Upper Extremity Veins. TECHNIQUE: Real-time ultrasound scan of the veins of the right upper extremity with color Doppler flow, spectral waveform analysis and compression. COMPARISON: None available. CLINICAL HISTORY: 48 year old female with shoulder pain. FINDINGS: SUPERFICIAL VEINS: The cephalic and basilic veins are compressible, and demonstrate normal color Doppler flow. DEEP VEINS: The internal jugular, subclavian, axillary and brachial veins are compressible, and demonstrate normal color Doppler flow. SOFT TISSUES:  Negative. IMPRESSION: 1. No evidence of deep venous thrombosis in the right upper extremity. Electronically signed by: Helayne Hurst MD 10/29/2024 11:18 AM EST RP Workstation: [id number]     Procedures   Medications Ordered in the ED  naproxen  (NAPROSYN ) tablet 500 mg (500 mg Oral Given 10/29/24 1103)                                    Medical Decision Making Amount and/or Complexity of Data Reviewed Labs: ordered. Radiology: ordered.  Risk Prescription drug management.   This patient presents to the ED for concern of arm pain.  Differential diagnosis includes cellulitis, DVT, cervical radiculopathy, biceps tendinopathy    Additional history obtained:  Additional history obtained from chart review   Lab Tests:  I Ordered, and personally interpreted labs.  The pertinent results include: CBC and CMP unremarkable, uric acid at 5.0   Imaging Studies ordered:  I ordered imaging studies including x-ray of the right shoulder, right hand, ultrasound of the right upper extremity I independently visualized and interpreted imaging which showed negative for any acute fracture, dislocation, or other traumatic findings.  Ultrasound negative for DVT I agree with the radiologist interpretation   Medicines ordered and prescription drug management:  I ordered medication including naproxen  for pain Reevaluation of the patient after these medicines showed that the patient improved I have reviewed the patients home medicines and have made adjustments as needed   Problem List / ED Course:  Patient presents to the emergency department with concerns of arm pain.  Reportedly has been dealing with arm pain for several weeks and was seen at another emergency department and diagnosed with a blood clot according to patient.  She reports that she is currently taking antibiotics out of concern for possible infection.  She is not currently on any blood thinner.  Denies any weakness, tingling, or  numbness. On exam, patient has tenderness to multiple areas in the right arm including the right index finger, right wrist, and right elbow as well as right shoulder.  There is no significant impairment in range of motion.  No obvious erythema, swelling, or skin induration or fluctuance. Workup is negative with unremarkable CBC and CMP.  Uric acid reassuring at 5.0.  X-rays obtained of the right shoulder and right hand are negative.  Ultrasound the right upper extremity is negative for DVT.  Unclear exactly what is causing patient's current symptoms but does not appear to be due to cellulitis, bony injury, or DVT.  Advised continued use of Tylenol  and/or ibuprofen  for pain as needed.  Encourage close follow-up with PCP.  Return precautions discussed such as concerns for new or worsening symptoms.  Discharged home in stable condition.   Social Determinants of Health:  None  Final diagnoses:  Right arm pain    ED Discharge Orders          Ordered    diclofenac  Sodium (VOLTAREN ) 1 % GEL  4 times daily        10/29/24 1237    lidocaine  (LIDODERM ) 5 %  Every 24 hours        10/29/24 1237               Cecily Legrand LABOR, PA-C 10/30/24 2347    Mannie Pac T, DO 11/07/24 0703

## 2024-10-29 NOTE — Discharge Instructions (Signed)
 You were seen in the department today for concerns of arm pain.  Your labs and imaging today were thankfully reassuring without any obvious findings.  He did not have any findings of a blood clot in the right arm.  I recommend that he take either Tylenol  or ibuprofen  for pain as needed.  I sent 2 prescriptions to her pharmacy to help with her pain including to pump medications called Voltaren  and Lidoderm .  Please use this as prescribed.  For any concerns of new or worsening symptoms, return to the emergency department.

## 2024-10-30 NOTE — Telephone Encounter (Signed)
 Checked with the pharmacy at 1400 today to confirmed they received the medications order. Left voicemail message for patient to let her know the medication received by pharmacy and will be ready in 1 hour to 1.5 hours.

## 2024-10-30 NOTE — Telephone Encounter (Signed)
 Pt is at pharmacy and stating they haven't received the rx   predniSONE  (STERAPRED) 5 mg DsPk dose pack [[id number]]   doxycycline (VIBRA-TABS) 100 mg tablet [[id number]]   [id number] - [place], [place] - [address] AT [facility] [address] - PHONE: [phone] - FAX: [phone]

## 2024-11-02 ENCOUNTER — Encounter (HOSPITAL_BASED_OUTPATIENT_CLINIC_OR_DEPARTMENT_OTHER): Payer: Self-pay

## 2024-11-02 ENCOUNTER — Emergency Department (HOSPITAL_BASED_OUTPATIENT_CLINIC_OR_DEPARTMENT_OTHER)
Admission: EM | Admit: 2024-11-02 | Discharge: 2024-11-02 | Disposition: A | Discharge status: Home / Self Care | Payer: MEDICAID | Attending: Emergency Medicine | Admitting: Emergency Medicine

## 2024-11-02 ENCOUNTER — Other Ambulatory Visit: Payer: Self-pay

## 2024-11-02 DIAGNOSIS — M65941 Unspecified synovitis and tenosynovitis, right hand: Secondary | ICD-10-CM | POA: Diagnosis not present

## 2024-11-02 DIAGNOSIS — E119 Type 2 diabetes mellitus without complications: Secondary | ICD-10-CM | POA: Insufficient documentation

## 2024-11-02 DIAGNOSIS — J45909 Unspecified asthma, uncomplicated: Secondary | ICD-10-CM | POA: Diagnosis not present

## 2024-11-02 DIAGNOSIS — I10 Essential (primary) hypertension: Secondary | ICD-10-CM | POA: Insufficient documentation

## 2024-11-02 DIAGNOSIS — Z7982 Long term (current) use of aspirin: Secondary | ICD-10-CM | POA: Diagnosis not present

## 2024-11-02 DIAGNOSIS — M79641 Pain in right hand: Secondary | ICD-10-CM | POA: Diagnosis present

## 2024-11-02 DIAGNOSIS — M65949 Unspecified synovitis and tenosynovitis, unspecified hand: Secondary | ICD-10-CM

## 2024-11-02 MED ORDER — OXYCODONE-ACETAMINOPHEN 5-325 MG PO TABS
1.0000 | ORAL_TABLET | Freq: Once | ORAL | Status: AC
  Administered 2024-11-02: 1 via ORAL
  Filled 2024-11-02: qty 1

## 2024-11-02 MED ORDER — OXYCODONE HCL 5 MG PO TABS: 5.0000 mg | ORAL_TABLET | ORAL | 0 refills | Status: AC | PRN

## 2024-11-02 NOTE — Discharge Instructions (Addendum)
 Wear the brace as needed.  Apply ice for 30 minutes at a time, 4 times a day.  Continue taking naproxen -2 tablets at a time, twice a day.  Add acetaminophen  as needed for additional pain relief.  If you still are not getting adequate pain relief, you may also take oxycodone .  I recommend that you reserve oxycodone  for nighttime, since it can make you drowsy.

## 2024-11-02 NOTE — ED Triage Notes (Signed)
 Patient reports right hand pain that shoots down her arm.  Patient has had difficulty with this for approximately one month, seen by urgent care for spider bite on hand and finished antibiotics.  Patient reports previously having a blood clot in her right arm that she has completed treatment for, and since then has had repeat images to rule out any further blood clots in that arm.  Patient reports that the pain is unbearable and has to keep it elevated to help with the pain. Reports trying lidocaine  cream and it did not help, and just irritated the area.
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
## 2024-11-09 ENCOUNTER — Other Ambulatory Visit (HOSPITAL_BASED_OUTPATIENT_CLINIC_OR_DEPARTMENT_OTHER): Payer: Self-pay | Admitting: Orthopedic Surgery

## 2024-11-09 DIAGNOSIS — M79641 Pain in right hand: Secondary | ICD-10-CM

## 2024-11-14 ENCOUNTER — Ambulatory Visit (HOSPITAL_BASED_OUTPATIENT_CLINIC_OR_DEPARTMENT_OTHER)
Admission: RE | Admit: 2024-11-14 | Discharge: 2024-11-14 | Payer: MEDICAID | Attending: Orthopedic Surgery | Admitting: Orthopedic Surgery

## 2024-11-14 DIAGNOSIS — M79641 Pain in right hand: Secondary | ICD-10-CM
